# Patient Record
Sex: Male | Born: 1960 | Race: Black or African American | Hispanic: No | Smoking: Current every day smoker
Health system: Southern US, Community
[De-identification: ages and names within clinical notes are randomized; demographics above are authoritative.]

## PROBLEM LIST (undated history)

## (undated) DIAGNOSIS — W3400XA Accidental discharge from unspecified firearms or gun, initial encounter: Secondary | ICD-10-CM

## (undated) DIAGNOSIS — G8190 Hemiplegia, unspecified affecting unspecified side: Secondary | ICD-10-CM

## (undated) DIAGNOSIS — D860 Sarcoidosis of lung: Secondary | ICD-10-CM

## (undated) DIAGNOSIS — K852 Alcohol induced acute pancreatitis without necrosis or infection: Secondary | ICD-10-CM

## (undated) DIAGNOSIS — C3491 Malignant neoplasm of unspecified part of right bronchus or lung: Secondary | ICD-10-CM

## (undated) DIAGNOSIS — I10 Essential (primary) hypertension: Secondary | ICD-10-CM

## (undated) DIAGNOSIS — K2970 Gastritis, unspecified, without bleeding: Secondary | ICD-10-CM

## (undated) DIAGNOSIS — B9681 Helicobacter pylori [H. pylori] as the cause of diseases classified elsewhere: Secondary | ICD-10-CM

## (undated) DIAGNOSIS — R569 Unspecified convulsions: Secondary | ICD-10-CM

## (undated) DIAGNOSIS — E7800 Pure hypercholesterolemia, unspecified: Secondary | ICD-10-CM

## (undated) DIAGNOSIS — R918 Other nonspecific abnormal finding of lung field: Secondary | ICD-10-CM

## (undated) HISTORY — DX: Helicobacter pylori (H. pylori) as the cause of diseases classified elsewhere: B96.81

## (undated) HISTORY — DX: Other nonspecific abnormal finding of lung field: R91.8

## (undated) HISTORY — DX: Sarcoidosis of lung: D86.0

## (undated) HISTORY — DX: Gastritis, unspecified, without bleeding: K29.70

## (undated) HISTORY — DX: Alcohol induced acute pancreatitis without necrosis or infection: K85.20

---

## 2000-12-11 ENCOUNTER — Emergency Department (HOSPITAL_COMMUNITY): Admission: EM | Admit: 2000-12-11 | Discharge: 2000-12-11 | Payer: Self-pay | Admitting: Emergency Medicine

## 2000-12-11 ENCOUNTER — Encounter: Payer: Self-pay | Admitting: Emergency Medicine

## 2001-05-08 ENCOUNTER — Encounter: Payer: Self-pay | Admitting: *Deleted

## 2001-05-08 ENCOUNTER — Emergency Department (HOSPITAL_COMMUNITY): Admission: EM | Admit: 2001-05-08 | Discharge: 2001-05-08 | Payer: Self-pay | Admitting: *Deleted

## 2001-06-08 ENCOUNTER — Encounter: Payer: Self-pay | Admitting: Internal Medicine

## 2001-06-08 ENCOUNTER — Inpatient Hospital Stay (HOSPITAL_COMMUNITY): Admission: EM | Admit: 2001-06-08 | Discharge: 2001-06-10 | Payer: Self-pay | Admitting: Emergency Medicine

## 2001-06-08 ENCOUNTER — Encounter: Payer: Self-pay | Admitting: Emergency Medicine

## 2001-09-29 ENCOUNTER — Emergency Department (HOSPITAL_COMMUNITY): Admission: EM | Admit: 2001-09-29 | Discharge: 2001-09-29 | Payer: Self-pay | Admitting: Emergency Medicine

## 2001-10-15 ENCOUNTER — Emergency Department (HOSPITAL_COMMUNITY): Admission: EM | Admit: 2001-10-15 | Discharge: 2001-10-15 | Payer: Self-pay | Admitting: Emergency Medicine

## 2001-10-15 ENCOUNTER — Encounter: Payer: Self-pay | Admitting: Emergency Medicine

## 2001-12-24 ENCOUNTER — Encounter: Payer: Self-pay | Admitting: *Deleted

## 2001-12-24 ENCOUNTER — Emergency Department (HOSPITAL_COMMUNITY): Admission: EM | Admit: 2001-12-24 | Discharge: 2001-12-24 | Payer: Self-pay | Admitting: *Deleted

## 2002-03-04 ENCOUNTER — Emergency Department (HOSPITAL_COMMUNITY): Admission: EM | Admit: 2002-03-04 | Discharge: 2002-03-04 | Payer: Self-pay | Admitting: *Deleted

## 2002-05-02 ENCOUNTER — Emergency Department (HOSPITAL_COMMUNITY): Admission: EM | Admit: 2002-05-02 | Discharge: 2002-05-02 | Payer: Self-pay | Admitting: Emergency Medicine

## 2002-06-22 ENCOUNTER — Emergency Department (HOSPITAL_COMMUNITY): Admission: EM | Admit: 2002-06-22 | Discharge: 2002-06-22 | Payer: Self-pay | Admitting: *Deleted

## 2002-09-14 ENCOUNTER — Emergency Department (HOSPITAL_COMMUNITY): Admission: EM | Admit: 2002-09-14 | Discharge: 2002-09-14 | Payer: Self-pay | Admitting: Internal Medicine

## 2003-01-17 ENCOUNTER — Ambulatory Visit (HOSPITAL_COMMUNITY): Admission: RE | Admit: 2003-01-17 | Discharge: 2003-01-17 | Payer: Self-pay | Admitting: Urology

## 2003-01-17 ENCOUNTER — Encounter: Payer: Self-pay | Admitting: Urology

## 2003-09-03 ENCOUNTER — Observation Stay (HOSPITAL_COMMUNITY): Admission: EM | Admit: 2003-09-03 | Discharge: 2003-09-04 | Payer: Self-pay | Admitting: Emergency Medicine

## 2004-06-24 ENCOUNTER — Inpatient Hospital Stay (HOSPITAL_COMMUNITY): Admission: EM | Admit: 2004-06-24 | Discharge: 2004-06-27 | Payer: Self-pay | Admitting: *Deleted

## 2004-07-08 ENCOUNTER — Ambulatory Visit: Payer: Self-pay | Admitting: Family Medicine

## 2004-08-26 ENCOUNTER — Inpatient Hospital Stay (HOSPITAL_COMMUNITY): Admission: EM | Admit: 2004-08-26 | Discharge: 2004-08-30 | Payer: Self-pay | Admitting: Emergency Medicine

## 2004-09-01 ENCOUNTER — Emergency Department (HOSPITAL_COMMUNITY): Admission: EM | Admit: 2004-09-01 | Discharge: 2004-09-02 | Payer: Self-pay | Admitting: *Deleted

## 2004-09-17 ENCOUNTER — Ambulatory Visit: Payer: Self-pay | Admitting: Family Medicine

## 2004-09-23 ENCOUNTER — Encounter (HOSPITAL_COMMUNITY): Admission: RE | Admit: 2004-09-23 | Discharge: 2004-10-23 | Payer: Self-pay | Admitting: Family Medicine

## 2004-09-24 ENCOUNTER — Emergency Department (HOSPITAL_COMMUNITY): Admission: EM | Admit: 2004-09-24 | Discharge: 2004-09-24 | Payer: Self-pay | Admitting: Emergency Medicine

## 2004-09-25 ENCOUNTER — Ambulatory Visit: Payer: Self-pay | Admitting: Family Medicine

## 2004-10-09 ENCOUNTER — Emergency Department (HOSPITAL_COMMUNITY): Admission: EM | Admit: 2004-10-09 | Discharge: 2004-10-09 | Payer: Self-pay | Admitting: Emergency Medicine

## 2004-10-16 ENCOUNTER — Ambulatory Visit: Payer: Self-pay | Admitting: Family Medicine

## 2004-10-28 ENCOUNTER — Encounter (HOSPITAL_COMMUNITY): Admission: RE | Admit: 2004-10-28 | Discharge: 2004-11-27 | Payer: Self-pay | Admitting: Family Medicine

## 2004-12-02 ENCOUNTER — Encounter (HOSPITAL_COMMUNITY): Admission: RE | Admit: 2004-12-02 | Discharge: 2005-01-01 | Payer: Self-pay | Admitting: Family Medicine

## 2005-01-28 ENCOUNTER — Ambulatory Visit: Payer: Self-pay | Admitting: Family Medicine

## 2005-02-04 ENCOUNTER — Ambulatory Visit: Payer: Self-pay | Admitting: Family Medicine

## 2005-02-12 ENCOUNTER — Ambulatory Visit (HOSPITAL_COMMUNITY): Admission: RE | Admit: 2005-02-12 | Discharge: 2005-02-12 | Payer: Self-pay | Admitting: Family Medicine

## 2005-03-02 ENCOUNTER — Ambulatory Visit: Payer: Self-pay | Admitting: Family Medicine

## 2005-03-04 ENCOUNTER — Encounter (HOSPITAL_COMMUNITY): Admission: RE | Admit: 2005-03-04 | Discharge: 2005-04-03 | Payer: Self-pay | Admitting: Family Medicine

## 2005-05-27 ENCOUNTER — Inpatient Hospital Stay (HOSPITAL_COMMUNITY): Admission: EM | Admit: 2005-05-27 | Discharge: 2005-05-28 | Payer: Self-pay | Admitting: Emergency Medicine

## 2005-07-07 ENCOUNTER — Ambulatory Visit: Payer: Self-pay | Admitting: Family Medicine

## 2005-07-09 ENCOUNTER — Emergency Department (HOSPITAL_COMMUNITY): Admission: EM | Admit: 2005-07-09 | Discharge: 2005-07-09 | Payer: Self-pay | Admitting: Emergency Medicine

## 2005-07-13 ENCOUNTER — Ambulatory Visit (HOSPITAL_COMMUNITY): Admission: RE | Admit: 2005-07-13 | Discharge: 2005-07-13 | Payer: Self-pay | Admitting: Family Medicine

## 2005-07-27 ENCOUNTER — Emergency Department (HOSPITAL_COMMUNITY): Admission: EM | Admit: 2005-07-27 | Discharge: 2005-07-28 | Payer: Self-pay | Admitting: Emergency Medicine

## 2005-08-03 ENCOUNTER — Ambulatory Visit: Payer: Self-pay | Admitting: Family Medicine

## 2005-08-17 ENCOUNTER — Emergency Department (HOSPITAL_COMMUNITY): Admission: EM | Admit: 2005-08-17 | Discharge: 2005-08-17 | Payer: Self-pay | Admitting: Emergency Medicine

## 2005-08-26 ENCOUNTER — Ambulatory Visit: Payer: Self-pay | Admitting: Family Medicine

## 2005-09-17 ENCOUNTER — Emergency Department (HOSPITAL_COMMUNITY): Admission: EM | Admit: 2005-09-17 | Discharge: 2005-09-17 | Payer: Self-pay | Admitting: Emergency Medicine

## 2005-09-26 ENCOUNTER — Emergency Department (HOSPITAL_COMMUNITY): Admission: EM | Admit: 2005-09-26 | Discharge: 2005-09-27 | Payer: Self-pay | Admitting: Emergency Medicine

## 2005-09-29 ENCOUNTER — Emergency Department (HOSPITAL_COMMUNITY): Admission: EM | Admit: 2005-09-29 | Discharge: 2005-09-29 | Payer: Self-pay | Admitting: Emergency Medicine

## 2005-10-22 ENCOUNTER — Ambulatory Visit: Payer: Self-pay | Admitting: Family Medicine

## 2005-12-15 ENCOUNTER — Ambulatory Visit: Payer: Self-pay | Admitting: Family Medicine

## 2006-02-10 ENCOUNTER — Ambulatory Visit: Payer: Self-pay | Admitting: Family Medicine

## 2006-04-08 ENCOUNTER — Ambulatory Visit: Payer: Self-pay | Admitting: Family Medicine

## 2006-06-02 ENCOUNTER — Ambulatory Visit: Payer: Self-pay | Admitting: Family Medicine

## 2006-06-28 ENCOUNTER — Ambulatory Visit: Payer: Self-pay | Admitting: Family Medicine

## 2006-09-14 ENCOUNTER — Ambulatory Visit (HOSPITAL_COMMUNITY): Admission: RE | Admit: 2006-09-14 | Discharge: 2006-09-14 | Payer: Self-pay | Admitting: Family Medicine

## 2006-09-14 ENCOUNTER — Ambulatory Visit: Payer: Self-pay | Admitting: Family Medicine

## 2007-04-14 ENCOUNTER — Encounter: Payer: Self-pay | Admitting: Family Medicine

## 2007-07-02 ENCOUNTER — Inpatient Hospital Stay (HOSPITAL_COMMUNITY): Admission: RE | Admit: 2007-07-02 | Discharge: 2007-07-04 | Payer: Self-pay | Admitting: Cardiology

## 2007-07-02 ENCOUNTER — Ambulatory Visit: Payer: Self-pay | Admitting: Cardiology

## 2008-04-13 DIAGNOSIS — B9681 Helicobacter pylori [H. pylori] as the cause of diseases classified elsewhere: Secondary | ICD-10-CM

## 2008-04-13 HISTORY — DX: Helicobacter pylori (H. pylori) as the cause of diseases classified elsewhere: B96.81

## 2008-06-29 ENCOUNTER — Ambulatory Visit: Payer: Self-pay | Admitting: Gastroenterology

## 2008-06-29 ENCOUNTER — Inpatient Hospital Stay (HOSPITAL_COMMUNITY): Admission: EM | Admit: 2008-06-29 | Discharge: 2008-07-01 | Payer: Self-pay | Admitting: Emergency Medicine

## 2008-06-30 ENCOUNTER — Ambulatory Visit: Payer: Self-pay | Admitting: Internal Medicine

## 2008-07-03 ENCOUNTER — Telehealth (INDEPENDENT_AMBULATORY_CARE_PROVIDER_SITE_OTHER): Payer: Self-pay | Admitting: *Deleted

## 2008-07-10 ENCOUNTER — Encounter: Payer: Self-pay | Admitting: Gastroenterology

## 2008-07-18 ENCOUNTER — Encounter: Payer: Self-pay | Admitting: Internal Medicine

## 2008-07-23 ENCOUNTER — Telehealth (INDEPENDENT_AMBULATORY_CARE_PROVIDER_SITE_OTHER): Payer: Self-pay | Admitting: *Deleted

## 2008-08-03 DIAGNOSIS — R569 Unspecified convulsions: Secondary | ICD-10-CM | POA: Insufficient documentation

## 2008-08-03 DIAGNOSIS — K859 Acute pancreatitis without necrosis or infection, unspecified: Secondary | ICD-10-CM | POA: Insufficient documentation

## 2008-08-03 DIAGNOSIS — I10 Essential (primary) hypertension: Secondary | ICD-10-CM | POA: Insufficient documentation

## 2008-08-03 DIAGNOSIS — G8190 Hemiplegia, unspecified affecting unspecified side: Secondary | ICD-10-CM | POA: Insufficient documentation

## 2008-08-03 DIAGNOSIS — F17200 Nicotine dependence, unspecified, uncomplicated: Secondary | ICD-10-CM

## 2008-08-30 ENCOUNTER — Encounter: Payer: Self-pay | Admitting: Gastroenterology

## 2008-08-30 ENCOUNTER — Ambulatory Visit: Payer: Self-pay | Admitting: Internal Medicine

## 2008-08-30 DIAGNOSIS — K219 Gastro-esophageal reflux disease without esophagitis: Secondary | ICD-10-CM | POA: Insufficient documentation

## 2008-08-30 DIAGNOSIS — R040 Epistaxis: Secondary | ICD-10-CM

## 2008-08-30 DIAGNOSIS — R310 Gross hematuria: Secondary | ICD-10-CM

## 2008-08-30 LAB — CONVERTED CEMR LAB
ALT: 20 units/L (ref 0–53)
AST: 15 units/L (ref 0–37)
Alkaline Phosphatase: 153 units/L — ABNORMAL HIGH (ref 39–117)
Bilirubin, Direct: 0.1 mg/dL (ref 0.0–0.3)
Eosinophils Relative: 2 % (ref 0–5)
HCT: 41.3 % (ref 39.0–52.0)
Hemoglobin: 14 g/dL (ref 13.0–17.0)
Indirect Bilirubin: 0.2 mg/dL (ref 0.0–0.9)
Lipase: 39 units/L (ref 0–75)
Lymphocytes Relative: 41 % (ref 12–46)
Lymphs Abs: 3.5 10*3/uL (ref 0.7–4.0)
Monocytes Absolute: 0.5 10*3/uL (ref 0.1–1.0)
Monocytes Relative: 6 % (ref 3–12)
Neutro Abs: 4.2 10*3/uL (ref 1.7–7.7)
RBC: 4.61 M/uL (ref 4.22–5.81)
Total Bilirubin: 0.3 mg/dL (ref 0.3–1.2)

## 2008-09-05 ENCOUNTER — Telehealth: Payer: Self-pay | Admitting: Gastroenterology

## 2008-09-06 ENCOUNTER — Encounter: Payer: Self-pay | Admitting: Gastroenterology

## 2008-11-14 ENCOUNTER — Encounter (INDEPENDENT_AMBULATORY_CARE_PROVIDER_SITE_OTHER): Payer: Self-pay | Admitting: *Deleted

## 2008-11-14 ENCOUNTER — Encounter (INDEPENDENT_AMBULATORY_CARE_PROVIDER_SITE_OTHER): Payer: Self-pay

## 2009-01-10 ENCOUNTER — Ambulatory Visit: Payer: Self-pay | Admitting: Gastroenterology

## 2009-01-16 ENCOUNTER — Encounter: Payer: Self-pay | Admitting: Gastroenterology

## 2009-01-25 ENCOUNTER — Ambulatory Visit: Payer: Self-pay | Admitting: Gastroenterology

## 2009-01-25 ENCOUNTER — Ambulatory Visit (HOSPITAL_COMMUNITY): Admission: RE | Admit: 2009-01-25 | Discharge: 2009-01-25 | Payer: Self-pay | Admitting: Family Medicine

## 2009-01-25 ENCOUNTER — Encounter: Payer: Self-pay | Admitting: Gastroenterology

## 2009-01-25 HISTORY — PX: ESOPHAGOGASTRODUODENOSCOPY: SHX1529

## 2009-01-25 HISTORY — PX: COLONOSCOPY: SHX174

## 2009-01-28 ENCOUNTER — Encounter: Payer: Self-pay | Admitting: Gastroenterology

## 2009-03-06 ENCOUNTER — Telehealth (INDEPENDENT_AMBULATORY_CARE_PROVIDER_SITE_OTHER): Payer: Self-pay

## 2009-04-03 ENCOUNTER — Encounter (INDEPENDENT_AMBULATORY_CARE_PROVIDER_SITE_OTHER): Payer: Self-pay | Admitting: *Deleted

## 2009-06-06 ENCOUNTER — Ambulatory Visit: Payer: Self-pay | Admitting: Internal Medicine

## 2009-06-06 DIAGNOSIS — Z860100 Personal history of colon polyps, unspecified: Secondary | ICD-10-CM | POA: Insufficient documentation

## 2009-06-06 DIAGNOSIS — F1010 Alcohol abuse, uncomplicated: Secondary | ICD-10-CM | POA: Insufficient documentation

## 2009-06-06 DIAGNOSIS — Z8719 Personal history of other diseases of the digestive system: Secondary | ICD-10-CM

## 2009-11-03 ENCOUNTER — Emergency Department (HOSPITAL_COMMUNITY): Admission: EM | Admit: 2009-11-03 | Discharge: 2009-11-03 | Payer: Self-pay | Admitting: Emergency Medicine

## 2010-05-15 NOTE — Letter (Signed)
Summary: external records  external records   Imported By: Curtis Sites 12/03/2009 08:37:18  _____________________________________________________________________  External Attachment:    Type:   Image     Comment:   External Document
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
## 2010-05-20 ENCOUNTER — Encounter (INDEPENDENT_AMBULATORY_CARE_PROVIDER_SITE_OTHER): Payer: Self-pay | Admitting: *Deleted

## 2010-05-29 NOTE — Letter (Signed)
Summary: Recall Office Visit  [facility]  [address]   [place], [name] [address]   Phone: [phone]  Fax: [phone]      May 20, 2010   [name] [address] [place], [place]  [address] 07/23/1960   Dear Mr. Guia,   According to our records, it is time for you to schedule a follow-up office visit with us.   At your convenience, please call [phone] to schedule an office visit. If you have any questions, concerns, or feel that this letter is in error, we would appreciate your call.   Sincerely,    Susan Sherman  [facility] Ph: [phone]   Fax: [phone] 

## 2010-05-29 NOTE — Letter (Signed)
Summary: Recall Office Visit  [facility]  [address]   [place], [place] [address]   Phone: [phone]  Fax: [phone]      May 20, 2010   [facility] Heuer [address] [place], [place]  [address] Mar 24, 1961   Dear [name],   According to our records, it is time for you to schedule a follow-up office visit with [place].   At your convenience, please call [phone] to schedule an office visit. If you have any questions, concerns, or feel that this letter is in error, we would appreciate your call.   Sincerely,    [name]  [facility] Ph: [phone]   Fax: [phone]

## 2010-07-24 LAB — DIFFERENTIAL
Basophils Absolute: 0 10*3/uL (ref 0.0–0.1)
Basophils Relative: 1 % (ref 0–1)
Eosinophils Absolute: 0.2 10*3/uL (ref 0.0–0.7)
Lymphocytes Relative: 34 % (ref 12–46)
Lymphs Abs: 2.6 10*3/uL (ref 0.7–4.0)
Monocytes Absolute: 0.5 10*3/uL (ref 0.1–1.0)
Neutro Abs: 3.8 10*3/uL (ref 1.7–7.7)
Neutro Abs: 6 10*3/uL (ref 1.7–7.7)
Neutrophils Relative %: 63 % (ref 43–77)

## 2010-07-24 LAB — RAPID URINE DRUG SCREEN, HOSP PERFORMED
Barbiturates: POSITIVE — AB
Cocaine: NOT DETECTED
Opiates: POSITIVE — AB
Tetrahydrocannabinol: POSITIVE — AB

## 2010-07-24 LAB — COMPREHENSIVE METABOLIC PANEL
ALT: 28 U/L (ref 0–53)
Albumin: 3.2 g/dL — ABNORMAL LOW (ref 3.5–5.2)
BUN: 6 mg/dL (ref 6–23)
BUN: 7 mg/dL (ref 6–23)
CO2: 24 mEq/L (ref 19–32)
Calcium: 8.8 mg/dL (ref 8.4–10.5)
Chloride: 106 mEq/L (ref 96–112)
Creatinine, Ser: 0.76 mg/dL (ref 0.4–1.5)
GFR calc non Af Amer: >60 mL/min (ref >60)
Glucose, Bld: 143 mg/dL — ABNORMAL HIGH (ref 70–99)
Sodium: 135 mEq/L (ref 135–145)
Total Bilirubin: 0.4 mg/dL (ref 0.3–1.2)
Total Protein: 6.3 g/dL (ref 6.0–8.3)

## 2010-07-24 LAB — LIPASE, BLOOD: Lipase: 172 U/L — ABNORMAL HIGH (ref 11–59)

## 2010-07-24 LAB — HEPATIC FUNCTION PANEL
ALT: 24 U/L (ref 0–53)
AST: 20 U/L (ref 0–37)
Albumin: 3.6 g/dL (ref 3.5–5.2)
Alkaline Phosphatase: 157 U/L — ABNORMAL HIGH (ref 39–117)
Bilirubin, Direct: 0.1 mg/dL (ref 0.0–0.3)
Indirect Bilirubin: 0.1 mg/dL — ABNORMAL LOW (ref 0.3–0.9)
Total Bilirubin: 0.2 mg/dL — ABNORMAL LOW (ref 0.3–1.2)
Total Protein: 6.8 g/dL (ref 6.0–8.3)

## 2010-07-24 LAB — CBC
HCT: 42.8 % (ref 39.0–52.0)
HCT: 43 % (ref 39.0–52.0)
Hemoglobin: 14.9 g/dL (ref 13.0–17.0)
MCHC: 34.8 g/dL (ref 30.0–36.0)
MCV: 91.6 fL (ref 78.0–100.0)
MCV: 91.7 fL (ref 78.0–100.0)
Platelets: 132 10*3/uL — ABNORMAL LOW (ref 150–400)
RBC: 4.67 MIL/uL (ref 4.22–5.81)
RDW: 13.2 % (ref 11.5–15.5)

## 2010-07-24 LAB — URINALYSIS, ROUTINE W REFLEX MICROSCOPIC
Ketones, ur: NEGATIVE mg/dL
Nitrite: NEGATIVE
Protein, ur: NEGATIVE mg/dL
pH: 6.5 (ref 5.0–8.0)
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
## 2010-08-29 ENCOUNTER — Other Ambulatory Visit: Payer: Self-pay | Admitting: Medical
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
## 2010-08-29 NOTE — Group Therapy Note (Signed)
[name], [name] NO.:  [id number]   MEDICAL RECORD NO.:  [id number]          PATIENT TYPE:  INP   LOCATION:  IC06                          FACILITY:  [facility]   PHYSICIAN:  Vania Rea, M.D. DATE OF BIRTH:  [birth year]   DATE OF PROCEDURE:  06/25/2004  DATE OF DISCHARGE:                                   PROGRESS NOTE   Hospital day #2.  Patient did well overnight.  He had no further seizure  since being loaded with Dilantin and phenobarbital.  He developed fever  overnight and was started on antibiotics.  May have had some aspiration  issues since his ET tube was loose.  He was successfully extubated this  morning.   OBJECTIVE:  VITAL SIGNS:  His temperature is 101.1, Tmax 102.5, pulse 102,  respirations 20, blood pressure 144/95.  CHEST:  He has prolonged expiration and occasional diffuse rhonchi.  CARDIOVASCULAR:  Regular rhythm.  ABDOMEN:  Soft and nontender.  EXTREMITIES:  Without edema.   LABORATORIES:  White count has risen to 3.8.  His hemoglobin is stable at  14.1.  His serum chemistry:  Sodium 127, potassium 3.2, BUN 4, glucose 119.  His chest x-ray shows peribronchial thickening.   When questioned patient admits to smoking one pack per day of cigarettes.   ASSESSMENT:  1.  Status epilepticus, resolved.  2.  Acute bronchitis.  3.  Possible aspiration.  4.  Meningitis unlikely.  5.  Hypokalemia  6.  Hyponatremia   PLAN:  Continue to monitor phenobarbital and Dilantin levels.  Continue oral  phenobarbital and Dilantin.  Reduce Dilantin to low dose tonight, then  resume his normal dose the following night.  Replace K+; Will start him on a  progressive diet and that should resolve metabplic issues. Will change  antibx to Levaquin and clindamycin.      LC/MEDQ  D:  06/25/2004  T:  06/25/2004  Job:  [id number]
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
## 2010-08-29 NOTE — Discharge Summary (Signed)
[facility]  Patient:    [name], [name] Visit Number: [id number] MRN: [id number]          Service Type: MED Location: 2A A215 01 Attending Physician:  Cassell Smiles. Dictated by:   Elfredia Nevins, M.D. Admit Date:  06/08/2001 Discharge Date: 06/10/2001                             Discharge Summary  DISCHARGE DIAGNOSES: 1. Hypertension. 2. Abdominal pain secondary to partial small bowel obstruction. 3. 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  DISCHARGE MEDICATIONS:  Dilantin, phenobarbital.  SUMMARY:  Patient was admitted with abdominal pain on February 26.  He had a history of alcohol use and a one day duration of illness.  He responded to IV intervention after a brief episode of NG tube aspiration and bowel rest.  Has gradually improved.  Discharged eating well. Dictated by:   Elfredia Nevins, M.D. Attending Physician:  Cassell Smiles DD:  07/07/01 TD:  07/07/01 Job: [id number]
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
## 2010-08-29 NOTE — Procedures (Signed)
NAMEFILMORE, [name]                ACCOUNT NO.:  [id number]   MEDICAL RECORD NO.:  [id number]          PATIENT TYPE:  OUT   LOCATION:  RAD                           FACILITY:  [facility]   PHYSICIAN:  Dani Gobble, MD       DATE OF BIRTH:  03/03/1961   DATE OF PROCEDURE:  07/13/2005  DATE OF DISCHARGE:                                  ECHOCARDIOGRAM   REFERRING PHYSICIAN:  Dorthula Rue. Early Chars, MD   INDICATIONS:  50 year old gentleman with past medical history of  hypertension and murmur.   Technical quality of this study is adequate.   The aorta measures normally at 3 cm.   Left atrium is normal at 3.7 cm.  The patient appeared to be in sinus rhythm  during procedure.   Interventricular septum is mildly thickened as is the posterior wall.  The  aortic valve appeared to be mildly thickened but with normal leaflet  excursion.  Doppler interrogation of the aortic valve reveals peak velocity  of 1.9 meters per second corresponding to a peak gradient of 14 mmHg and  mean gradient of 8 mmHg.   The mitral valve appeared grossly structurally normal.  No mitral prolapse  is noted.  Mild mitral regurgitation is noted. Doppler interrogation of the  mitral valve is within normal limits.   The pulmonic valve was not well visualized.   Tricuspid valve appeared grossly structurally normal with mild tricuspid  regurgitation noted.   The left ventricle measured normal in size with LV IDD measured at 4 cm and  LV IC measured at 2.2 cm.  Overall left ventricular systolic function was  quite vigorous, and no regional wall motion abnormalities were noted.  There  was no evidence for diastolic dysfunction.   The right atrium and right ventricle were normal in size.  Right ventricular  systolic function was normal.   IMPRESSION:  1.  Mild concentric left ventricular hypertrophy.  2.  Mild aortic sclerosis without hemodynamically significant stenosis.  3.  Mild mitral and tricuspid regurgitation.  4.   Normal left ventricular size with vigorous left ventricle systolic      function and no regional wall motion abnormalities noted.           ______________________________  Dani Gobble, MD     AB/MEDQ  D:  07/13/2005  T:  07/13/2005  Job:  [id number]   cc:   Dorthula Rue. Early Chars, MD  Fax: [phone]
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
## 2010-08-29 NOTE — H&P (Signed)
[name], [name]                ACCOUNT NO.:  [id number]   MEDICAL RECORD NO.:  [id number]          PATIENT TYPE:  INP   LOCATION:  IC06                          FACILITY:  [facility]   PHYSICIAN:  Vania Rea, M.D. DATE OF BIRTH:  [birth year]   DATE OF ADMISSION:  06/24/2004  DATE OF DISCHARGE:  LH                                HISTORY & PHYSICAL   DUPLICATE ERROR   PRIMARY CARE PHYSICIAN:   CHIEF COMPLAINT:   HISTORY OF PRESENT ILLNESS:   PAST MEDICAL HISTORY:   MEDICATIONS:   ALLERGIES:   SOCIAL HISTORY:   FAMILY HISTORY:   REVIEW OF SYSTEMS:   PHYSICAL EXAMINATION:      LC/MEDQ  D:  06/24/2004  T:  06/24/2004  Job:  [id number]
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
## 2010-09-16 ENCOUNTER — Emergency Department (HOSPITAL_COMMUNITY)
Admission: EM | Admit: 2010-09-16 | Discharge: 2010-09-16 | Disposition: A | Discharge status: Home / Self Care | Payer: Medicare Other | Attending: Emergency Medicine | Admitting: Emergency Medicine

## 2010-09-16 DIAGNOSIS — E119 Type 2 diabetes mellitus without complications: Secondary | ICD-10-CM | POA: Insufficient documentation

## 2010-09-16 DIAGNOSIS — Z79899 Other long term (current) drug therapy: Secondary | ICD-10-CM | POA: Insufficient documentation

## 2010-09-16 DIAGNOSIS — R569 Unspecified convulsions: Secondary | ICD-10-CM | POA: Insufficient documentation

## 2010-09-16 DIAGNOSIS — I10 Essential (primary) hypertension: Secondary | ICD-10-CM | POA: Insufficient documentation

## 2010-09-16 DIAGNOSIS — F17200 Nicotine dependence, unspecified, uncomplicated: Secondary | ICD-10-CM | POA: Insufficient documentation

## 2010-09-16 LAB — CBC
HCT: 36 % — ABNORMAL LOW (ref 39.0–52.0)
MCH: 30.8 pg (ref 26.0–34.0)
MCHC: 33.6 g/dL (ref 30.0–36.0)
MCV: 91.6 fL (ref 78.0–100.0)
RDW: 13.3 % (ref 11.5–15.5)

## 2010-09-16 LAB — BASIC METABOLIC PANEL
BUN: 33 mg/dL — ABNORMAL HIGH (ref 6–23)
CO2: 18 mEq/L — ABNORMAL LOW (ref 19–32)
Calcium: 9.5 mg/dL (ref 8.4–10.5)
Creatinine, Ser: 1.57 mg/dL — ABNORMAL HIGH (ref 0.4–1.5)
GFR calc Af Amer: 57 mL/min — ABNORMAL LOW (ref >60)
Glucose, Bld: 105 mg/dL — ABNORMAL HIGH (ref 70–99)

## 2010-11-20 ENCOUNTER — Encounter: Payer: Self-pay | Admitting: *Deleted

## 2010-11-20 ENCOUNTER — Emergency Department (HOSPITAL_COMMUNITY): Payer: Medicare Other

## 2010-11-20 ENCOUNTER — Inpatient Hospital Stay (HOSPITAL_COMMUNITY): Payer: Medicare Other

## 2010-11-20 ENCOUNTER — Inpatient Hospital Stay (HOSPITAL_COMMUNITY)
Admission: EM | Admit: 2010-11-20 | Discharge: 2010-11-22 | DRG: 069 | Disposition: A | Discharge status: Home / Self Care | Payer: Medicare Other | Attending: Internal Medicine | Admitting: Internal Medicine

## 2010-11-20 ENCOUNTER — Other Ambulatory Visit: Payer: Self-pay

## 2010-11-20 DIAGNOSIS — F17200 Nicotine dependence, unspecified, uncomplicated: Secondary | ICD-10-CM | POA: Diagnosis present

## 2010-11-20 DIAGNOSIS — S069XAS Unspecified intracranial injury with loss of consciousness status unknown, sequela: Secondary | ICD-10-CM

## 2010-11-20 DIAGNOSIS — Z8719 Personal history of other diseases of the digestive system: Secondary | ICD-10-CM

## 2010-11-20 DIAGNOSIS — E875 Hyperkalemia: Secondary | ICD-10-CM | POA: Diagnosis present

## 2010-11-20 DIAGNOSIS — E119 Type 2 diabetes mellitus without complications: Secondary | ICD-10-CM

## 2010-11-20 DIAGNOSIS — G8190 Hemiplegia, unspecified affecting unspecified side: Secondary | ICD-10-CM | POA: Diagnosis present

## 2010-11-20 DIAGNOSIS — Z86010 Personal history of colonic polyps: Secondary | ICD-10-CM

## 2010-11-20 DIAGNOSIS — R040 Epistaxis: Secondary | ICD-10-CM

## 2010-11-20 DIAGNOSIS — S069X9S Unspecified intracranial injury with loss of consciousness of unspecified duration, sequela: Secondary | ICD-10-CM

## 2010-11-20 DIAGNOSIS — K219 Gastro-esophageal reflux disease without esophagitis: Secondary | ICD-10-CM

## 2010-11-20 DIAGNOSIS — Z720 Tobacco use: Secondary | ICD-10-CM

## 2010-11-20 DIAGNOSIS — G8191 Hemiplegia, unspecified affecting right dominant side: Secondary | ICD-10-CM

## 2010-11-20 DIAGNOSIS — K859 Acute pancreatitis without necrosis or infection, unspecified: Secondary | ICD-10-CM

## 2010-11-20 DIAGNOSIS — I10 Essential (primary) hypertension: Secondary | ICD-10-CM | POA: Diagnosis present

## 2010-11-20 DIAGNOSIS — G459 Transient cerebral ischemic attack, unspecified: Principal | ICD-10-CM | POA: Diagnosis present

## 2010-11-20 DIAGNOSIS — G40909 Epilepsy, unspecified, not intractable, without status epilepticus: Secondary | ICD-10-CM | POA: Diagnosis present

## 2010-11-20 DIAGNOSIS — I639 Cerebral infarction, unspecified: Secondary | ICD-10-CM

## 2010-11-20 DIAGNOSIS — F1010 Alcohol abuse, uncomplicated: Secondary | ICD-10-CM

## 2010-11-20 DIAGNOSIS — E785 Hyperlipidemia, unspecified: Secondary | ICD-10-CM

## 2010-11-20 DIAGNOSIS — R569 Unspecified convulsions: Secondary | ICD-10-CM

## 2010-11-20 DIAGNOSIS — R310 Gross hematuria: Secondary | ICD-10-CM

## 2010-11-20 HISTORY — DX: Unspecified convulsions: R56.9

## 2010-11-20 HISTORY — DX: Pure hypercholesterolemia, unspecified: E78.00

## 2010-11-20 HISTORY — DX: Accidental discharge from unspecified firearms or gun, initial encounter: W34.00XA

## 2010-11-20 HISTORY — DX: Essential (primary) hypertension: I10

## 2010-11-20 LAB — GLUCOSE, CAPILLARY
Glucose-Capillary: 142 mg/dL — ABNORMAL HIGH (ref 70–99)
Glucose-Capillary: 101 mg/dL — ABNORMAL HIGH (ref 70–99)

## 2010-11-20 LAB — CBC
HCT: 34.8 % — ABNORMAL LOW (ref 39.0–52.0)
Platelets: 199 10*3/uL (ref 150–400)
RDW: 13.4 % (ref 11.5–15.5)
WBC: 7 10*3/uL (ref 4.0–10.5)

## 2010-11-20 LAB — DIFFERENTIAL
Basophils Absolute: 0 10*3/uL (ref 0.0–0.1)
Basophils Relative: 1 % (ref 0–1)
Lymphocytes Relative: 32 % (ref 12–46)
Monocytes Absolute: 0.5 10*3/uL (ref 0.1–1.0)
Neutro Abs: 4.1 10*3/uL (ref 1.7–7.7)
Neutrophils Relative %: 58 % (ref 43–77)

## 2010-11-20 LAB — COMPREHENSIVE METABOLIC PANEL
ALT: 12 U/L (ref 0–53)
AST: 15 U/L (ref 0–37)
Albumin: 3.7 g/dL (ref 3.5–5.2)
Calcium: 8.9 mg/dL (ref 8.4–10.5)
Creatinine, Ser: 1.07 mg/dL (ref 0.50–1.35)
Sodium: 138 mEq/L (ref 135–145)
Total Protein: 7.6 g/dL (ref 6.0–8.3)

## 2010-11-20 LAB — URINALYSIS, ROUTINE W REFLEX MICROSCOPIC
Bilirubin Urine: NEGATIVE
Leukocytes, UA: NEGATIVE
Nitrite: NEGATIVE
Specific Gravity, Urine: 1.03 (ref 1.005–1.030)
Urobilinogen, UA: 0.2 mg/dL (ref 0.0–1.0)
pH: 5.5 (ref 5.0–8.0)

## 2010-11-20 LAB — TYPE AND SCREEN: Antibody Screen: NEGATIVE

## 2010-11-20 LAB — APTT: aPTT: 29 seconds (ref 24–37)

## 2010-11-20 LAB — CK TOTAL AND CKMB (NOT AT ARMC)
CK, MB: 2.9 ng/mL (ref 0.3–4.0)
Relative Index: 1.5 (ref 0.0–2.5)
Total CK: 198 U/L (ref 7–232)

## 2010-11-20 LAB — PROTIME-INR: Prothrombin Time: 13.2 seconds (ref 11.6–15.2)

## 2010-11-20 LAB — TROPONIN I: Troponin I: <0.3 ng/mL (ref <0.30)

## 2010-11-20 MED ORDER — INSULIN ASPART 100 UNIT/ML ~~LOC~~ SOLN
0.0000 [IU] | Freq: Three times a day (TID) | SUBCUTANEOUS | Status: DC
  Administered 2010-11-20 – 2010-11-21 (×3): 2 [IU] via SUBCUTANEOUS
  Filled 2010-11-20: qty 3

## 2010-11-20 MED ORDER — SENNOSIDES-DOCUSATE SODIUM 8.6-50 MG PO TABS: 1.0000 | ORAL_TABLET | Freq: Every day | ORAL | Status: DC | PRN

## 2010-11-20 MED ORDER — PNEUMOCOCCAL VAC POLYVALENT 25 MCG/0.5ML IJ INJ
0.5000 mL | INJECTION | Freq: Once | INTRAMUSCULAR | Status: AC
  Administered 2010-11-21: 0.5 mL via INTRAMUSCULAR
  Filled 2010-11-20: qty 0.5

## 2010-11-20 MED ORDER — ACETAMINOPHEN 650 MG RE SUPP: 650.0000 mg | Freq: Four times a day (QID) | RECTAL | Status: DC | PRN

## 2010-11-20 MED ORDER — ENOXAPARIN SODIUM 30 MG/0.3ML ~~LOC~~ SOLN
30.0000 mg | SUBCUTANEOUS | Status: DC
  Administered 2010-11-20 – 2010-11-22 (×3): 30 mg via SUBCUTANEOUS
  Filled 2010-11-20 (×2): qty 0.8
  Filled 2010-11-20: qty 0.3

## 2010-11-20 MED ORDER — SODIUM CHLORIDE 0.9 % IV BOLUS (SEPSIS)
500.0000 mL | Freq: Once | INTRAVENOUS | Status: AC
  Administered 2010-11-20: 500 mL via INTRAVENOUS

## 2010-11-20 MED ORDER — SODIUM CHLORIDE 0.9 % IV SOLN
INTRAVENOUS | Status: DC
  Administered 2010-11-21: 06:00:00 via INTRAVENOUS

## 2010-11-20 MED ORDER — ONDANSETRON HCL 4 MG/2ML IJ SOLN: 4.0000 mg | Freq: Four times a day (QID) | INTRAMUSCULAR | Status: DC | PRN

## 2010-11-20 MED ORDER — ONDANSETRON HCL 4 MG PO TABS: 4.0000 mg | ORAL_TABLET | Freq: Four times a day (QID) | ORAL | Status: DC | PRN

## 2010-11-20 MED ORDER — ZOLPIDEM TARTRATE 5 MG PO TABS: 5.0000 mg | ORAL_TABLET | Freq: Every evening | ORAL | Status: DC | PRN

## 2010-11-20 MED ORDER — ACETAMINOPHEN 325 MG PO TABS: 650.0000 mg | ORAL_TABLET | Freq: Four times a day (QID) | ORAL | Status: DC | PRN

## 2010-11-20 MED ORDER — PNEUMOCOCCAL VAC POLYVALENT 25 MCG/0.5ML IJ INJ: 0.5000 mL | INJECTION | INTRAMUSCULAR | Status: DC

## 2010-11-20 MED ORDER — SODIUM CHLORIDE 0.9 % IV SOLN
INTRAVENOUS | Status: DC
  Administered 2010-11-20: 75 mL via INTRAVENOUS

## 2010-11-20 NOTE — ED Notes (Signed)
Hospitalist assessed pt and is going to enter admission orders.

## 2010-11-20 NOTE — Consult Note (Signed)
Spoke with Dr. Karilyn Cota who will see/admit the patient.

## 2010-11-20 NOTE — ED Notes (Signed)
Pt alert, oriented.  EDP performed neuro assessment on pt.  Pt denying pain.
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
## 2010-11-20 NOTE — ED Notes (Signed)
Pt to ct at 06:24, returning at 06:45

## 2010-11-20 NOTE — ED Notes (Signed)
Pt stating no increase or decrease in neurologic deficits, no vision change from his norm,swallow screen passed

## 2010-11-20 NOTE — ED Notes (Signed)
Pt alert and oriented.  VSS.  R side extremities at baseline.l  Weakness still noted to left side.
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
## 2010-11-20 NOTE — Progress Notes (Signed)
Care/disposition to Dr. Campos. 

## 2010-11-21 ENCOUNTER — Encounter (HOSPITAL_COMMUNITY): Payer: Self-pay

## 2010-11-21 DIAGNOSIS — I319 Disease of pericardium, unspecified: Secondary | ICD-10-CM

## 2010-11-21 LAB — COMPREHENSIVE METABOLIC PANEL
Albumin: 2.8 g/dL — ABNORMAL LOW (ref 3.5–5.2)
Alkaline Phosphatase: 126 U/L — ABNORMAL HIGH (ref 39–117)
BUN: 13 mg/dL (ref 6–23)
Creatinine, Ser: 0.97 mg/dL (ref 0.50–1.35)
GFR calc Af Amer: >60 mL/min (ref >60)
Glucose, Bld: 104 mg/dL — ABNORMAL HIGH (ref 70–99)
Potassium: 4.3 mEq/L (ref 3.5–5.1)
Total Protein: 6.4 g/dL (ref 6.0–8.3)

## 2010-11-21 LAB — CBC
HCT: 32.3 % — ABNORMAL LOW (ref 39.0–52.0)
MCV: 92.6 fL (ref 78.0–100.0)
Platelets: 164 10*3/uL (ref 150–400)
RBC: 3.49 MIL/uL — ABNORMAL LOW (ref 4.22–5.81)
RDW: 12.7 % (ref 11.5–15.5)
WBC: 7.6 10*3/uL (ref 4.0–10.5)

## 2010-11-21 LAB — GLUCOSE, CAPILLARY: Glucose-Capillary: 148 mg/dL — ABNORMAL HIGH (ref 70–99)

## 2010-11-21 LAB — HEMOGLOBIN A1C: Mean Plasma Glucose: 128 mg/dL — ABNORMAL HIGH (ref <117)

## 2010-11-21 MED ORDER — ASPIRIN 325 MG PO TABS
325.0000 mg | ORAL_TABLET | Freq: Every day | ORAL | Status: DC
  Administered 2010-11-21 – 2010-11-22 (×2): 325 mg via ORAL
  Filled 2010-11-21 (×2): qty 1

## 2010-11-21 MED ORDER — PHENOBARBITAL 97.2 MG PO TABS: 97.2000 mg | ORAL_TABLET | Freq: Two times a day (BID) | ORAL | Status: DC

## 2010-11-21 MED ORDER — AMLODIPINE BESYLATE 5 MG PO TABS
10.0000 mg | ORAL_TABLET | Freq: Every day | ORAL | Status: DC
Start: 2010-11-21 — End: 2010-11-22
  Administered 2010-11-21: 10 mg via ORAL
  Administered 2010-11-22: 5 mg via ORAL
  Filled 2010-11-21: qty 1
  Filled 2010-11-21: qty 2

## 2010-11-21 MED ORDER — PANTOPRAZOLE SODIUM 40 MG PO TBEC
40.0000 mg | DELAYED_RELEASE_TABLET | Freq: Every day | ORAL | Status: DC
  Administered 2010-11-21: 40 mg via ORAL
  Filled 2010-11-21 (×2): qty 1

## 2010-11-21 MED ORDER — LEVETIRACETAM 500 MG PO TABS
1000.0000 mg | ORAL_TABLET | Freq: Two times a day (BID) | ORAL | Status: DC
  Administered 2010-11-21: 1000 mg via ORAL
  Administered 2010-11-22: 500 mg via ORAL
  Filled 2010-11-21 (×2): qty 1

## 2010-11-21 MED ORDER — SIMVASTATIN 20 MG PO TABS
20.0000 mg | ORAL_TABLET | Freq: Every day | ORAL | Status: DC
  Administered 2010-11-21: 20 mg via ORAL
  Filled 2010-11-21: qty 1

## 2010-11-21 MED ORDER — OLMESARTAN MEDOXOMIL 20 MG PO TABS
40.0000 mg | ORAL_TABLET | Freq: Every day | ORAL | Status: DC
  Administered 2010-11-21: 40 mg via ORAL
  Administered 2010-11-22: 20 mg via ORAL
  Filled 2010-11-21: qty 1
  Filled 2010-11-21: qty 2

## 2010-11-21 MED ORDER — PHENOBARBITAL 100 MG PO TABS
100.0000 mg | ORAL_TABLET | Freq: Two times a day (BID) | ORAL | Status: DC
  Administered 2010-11-21 – 2010-11-22 (×2): 100 mg via ORAL
  Filled 2010-11-21 (×2): qty 1

## 2010-11-21 MED ORDER — LOSARTAN POTASSIUM 50 MG PO TABS
100.0000 mg | ORAL_TABLET | Freq: Every day | ORAL | Status: DC
  Administered 2010-11-21 – 2010-11-22 (×2): 100 mg via ORAL
  Filled 2010-11-21: qty 1
  Filled 2010-11-21: qty 2

## 2010-11-21 MED ORDER — HYDROCHLOROTHIAZIDE 25 MG PO TABS
25.0000 mg | ORAL_TABLET | Freq: Every day | ORAL | Status: DC
  Administered 2010-11-21 – 2010-11-22 (×2): 25 mg via ORAL
  Filled 2010-11-21 (×2): qty 1

## 2010-11-21 NOTE — Progress Notes (Signed)
*  PRELIMINARY RESULTS* Echocardiogram 2D Echocardiogram has been performed.  [name] 11/21/2010, 9:10 AM
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
## 2010-11-21 NOTE — Progress Notes (Signed)
Pt to be transferred per MD order. Report given to RN. Pt transferred via bed.

## 2010-11-22 ENCOUNTER — Inpatient Hospital Stay (HOSPITAL_COMMUNITY): Payer: Medicare Other

## 2010-11-22 LAB — GLUCOSE, CAPILLARY
Glucose-Capillary: 98 mg/dL (ref 70–99)
Glucose-Capillary: 128 mg/dL — ABNORMAL HIGH (ref 70–99)

## 2010-11-22 MED ORDER — ASPIRIN 325 MG PO TABS: 325.0000 mg | ORAL_TABLET | Freq: Every day | ORAL | Status: AC
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
## 2010-11-22 NOTE — Progress Notes (Signed)
Discharged home with family.

## 2010-11-27 NOTE — Progress Notes (Signed)
Encounter addended by: Ree Shay, RN on: 11/27/2010  1:53 PM<BR>     Documentation filed: Charges VN

## 2010-12-12 NOTE — Progress Notes (Signed)
Encounter addended by: Clarene Critchley on: 12/12/2010  8:52 AM<BR>     Documentation filed: Flowsheet VN

## 2011-01-05 LAB — CARDIAC PANEL(CRET KIN+CKTOT+MB+TROPI)
CK, MB: 5.2 — ABNORMAL HIGH
Relative Index: 0.3
Relative Index: 0.3
Total CK: 1848 — ABNORMAL HIGH
Troponin I: <0.01

## 2011-01-05 LAB — RAPID URINE DRUG SCREEN, HOSP PERFORMED
Amphetamines: NOT DETECTED
Barbiturates: POSITIVE — AB
Tetrahydrocannabinol: POSITIVE — AB

## 2011-01-05 LAB — DIFFERENTIAL
Eosinophils Absolute: 0.2
Eosinophils Relative: 2
Lymphocytes Relative: 35
Monocytes Absolute: 0.6
Neutrophils Relative %: 55

## 2011-01-05 LAB — CULTURE, BLOOD (ROUTINE X 2): Culture: NO GROWTH

## 2011-01-05 LAB — POCT CARDIAC MARKERS
CKMB, poc: 5.6
Myoglobin, poc: 140
Operator id: 216221

## 2011-01-05 LAB — COMPREHENSIVE METABOLIC PANEL
ALT: 26
AST: 30
Albumin: 3.3 — ABNORMAL LOW
Alkaline Phosphatase: 124 — ABNORMAL HIGH
Chloride: 103
GFR calc Af Amer: >60
Potassium: 4.1
Total Bilirubin: 0.5

## 2011-01-05 LAB — CBC
HCT: 41.7
MCHC: 35
MCV: 88.8
Platelets: 146 — ABNORMAL LOW
Platelets: 152
WBC: 8.4
WBC: 8.5

## 2011-01-05 LAB — BASIC METABOLIC PANEL
BUN: 9
Calcium: 8.7
Chloride: 103
Creatinine, Ser: 1.04
GFR calc Af Amer: >60

## 2011-01-05 LAB — LIPID PANEL
HDL: 23 — ABNORMAL LOW
Triglycerides: 157 — ABNORMAL HIGH

## 2011-01-05 LAB — SEDIMENTATION RATE: Sed Rate: 15

## 2011-06-20 ENCOUNTER — Encounter (HOSPITAL_COMMUNITY): Payer: Self-pay

## 2011-06-20 ENCOUNTER — Emergency Department (HOSPITAL_COMMUNITY): Payer: Medicare Other

## 2011-06-20 ENCOUNTER — Other Ambulatory Visit: Payer: Self-pay

## 2011-06-20 ENCOUNTER — Emergency Department (HOSPITAL_COMMUNITY)
Admission: EM | Admit: 2011-06-20 | Discharge: 2011-06-20 | Disposition: A | Discharge status: Home / Self Care | Payer: Medicare Other | Attending: Emergency Medicine | Admitting: Emergency Medicine

## 2011-06-20 DIAGNOSIS — F17200 Nicotine dependence, unspecified, uncomplicated: Secondary | ICD-10-CM | POA: Insufficient documentation

## 2011-06-20 DIAGNOSIS — R079 Chest pain, unspecified: Secondary | ICD-10-CM | POA: Insufficient documentation

## 2011-06-20 DIAGNOSIS — J189 Pneumonia, unspecified organism: Secondary | ICD-10-CM

## 2011-06-20 DIAGNOSIS — Z79899 Other long term (current) drug therapy: Secondary | ICD-10-CM | POA: Insufficient documentation

## 2011-06-20 LAB — POCT I-STAT, CHEM 8
Calcium, Ion: 1.05 mmol/L — ABNORMAL LOW (ref 1.12–1.32)
Chloride: 110 mEq/L (ref 96–112)
Glucose, Bld: 138 mg/dL — ABNORMAL HIGH (ref 70–99)
HCT: 41 % (ref 39.0–52.0)

## 2011-06-20 LAB — URINALYSIS, ROUTINE W REFLEX MICROSCOPIC
Bilirubin Urine: NEGATIVE
Ketones, ur: NEGATIVE mg/dL
Nitrite: NEGATIVE
Protein, ur: NEGATIVE mg/dL
Urobilinogen, UA: 0.2 mg/dL (ref 0.0–1.0)
pH: 7 (ref 5.0–8.0)

## 2011-06-20 LAB — CBC
MCH: 30.7 pg (ref 26.0–34.0)
MCHC: 34.5 g/dL (ref 30.0–36.0)
MCV: 88.9 fL (ref 78.0–100.0)
Platelets: 129 10*3/uL — ABNORMAL LOW (ref 150–400)
RDW: 13.7 % (ref 11.5–15.5)

## 2011-06-20 LAB — POCT I-STAT TROPONIN I

## 2011-06-20 MED ORDER — PANTOPRAZOLE SODIUM 40 MG IV SOLR
40.0000 mg | Freq: Once | INTRAVENOUS | Status: AC
  Administered 2011-06-20: 40 mg via INTRAVENOUS
  Filled 2011-06-20: qty 40

## 2011-06-20 MED ORDER — MOXIFLOXACIN HCL IN NACL 400 MG/250ML IV SOLN
400.0000 mg | Freq: Once | INTRAVENOUS | Status: AC
  Administered 2011-06-20: 400 mg via INTRAVENOUS
  Filled 2011-06-20: qty 250

## 2011-06-20 MED ORDER — MOXIFLOXACIN HCL 400 MG PO TABS: 400.0000 mg | ORAL_TABLET | Freq: Every day | ORAL | Status: AC

## 2011-06-20 MED ORDER — HYDROMORPHONE HCL PF 1 MG/ML IJ SOLN: 1.0000 mg | Freq: Once | INTRAMUSCULAR | Status: DC

## 2011-06-20 MED ORDER — SODIUM CHLORIDE 0.9 % IJ SOLN: 3.0000 mL | Freq: Two times a day (BID) | INTRAMUSCULAR | Status: DC

## 2011-06-20 MED ORDER — SODIUM CHLORIDE 0.9 % IV BOLUS (SEPSIS)
1000.0000 mL | Freq: Once | INTRAVENOUS | Status: AC
  Administered 2011-06-20: 1000 mL via INTRAVENOUS

## 2011-06-20 MED ORDER — IOHEXOL 350 MG/ML SOLN
60.0000 mL | Freq: Once | INTRAVENOUS | Status: AC | PRN
  Administered 2011-06-20: 60 mL via INTRAVENOUS

## 2011-06-20 NOTE — ED Notes (Signed)
A/O x 4, NAD, no CP/SOB reported.  Family at bedside.

## 2011-06-20 NOTE — ED Notes (Addendum)
Pt is discharged and [facility] has been called for transport to the pt's residence.  Awaiting transportation. Dinner tray ordered.

## 2011-06-20 NOTE — ED Notes (Signed)
Pt is sleeping

## 2011-06-20 NOTE — ED Notes (Signed)
Pt given dinner tray and sprite.  
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
## 2011-06-20 NOTE — ED Notes (Signed)
IV therapy team paged for additional IV access for pt having PE study.

## 2011-06-20 NOTE — ED Notes (Signed)
Pt voided 375 ccs of dark yellow urine on arrival to ED
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
## 2011-06-20 NOTE — ED Notes (Signed)
[facility] onscene for transport home.

## 2011-06-20 NOTE — ED Notes (Signed)
No bld cx ordered PT abx order

## 2011-06-20 NOTE — ED Notes (Signed)
Pt repositioned in bed for comfort.

## 2011-06-20 NOTE — Discharge Instructions (Signed)
YOU CAN BE DISCHARGED HOME AND SHOULD FOLLOW UP WITH YOUR DOCTOR FOR RECHECK IN 1-2 DAYS. TAKE AVELOX AS DIRECTED. TYLENOL IF ANY FEVER DEVELOPS AND FOR ACHES AS NEEDED. RETURN HERE WITH ANY HIGH FEVER, SHORTNESS OF BREATH OR NEW CONCERN.  Pneumonia, Adult Pneumonia is an infection of the lungs.  CAUSES Pneumonia may be caused by bacteria or a virus. Usually, these infections are caused by breathing infectious particles into the lungs (respiratory tract). SYMPTOMS   Cough.   Fever.   Chest pain.   Increased rate of breathing.   Wheezing.   Mucus production.  DIAGNOSIS  If you have the common symptoms of pneumonia, your caregiver will typically confirm the diagnosis with a chest X-ray. The X-ray will show an abnormality in the lung (pulmonary infiltrate) if you have pneumonia. Other tests of your blood, urine, or sputum may be done to find the specific cause of your pneumonia. Your caregiver may also do tests (blood gases or pulse oximetry) to see how well your lungs are working. TREATMENT  Some forms of pneumonia may be spread to other people when you cough or sneeze. You may be asked to wear a mask before and during your exam. Pneumonia that is caused by bacteria is treated with antibiotic medicine. Pneumonia that is caused by the influenza virus may be treated with an antiviral medicine. Most other viral infections must run their course. These infections will not respond to antibiotics.  PREVENTION A pneumococcal shot (vaccine) is available to prevent a common bacterial cause of pneumonia. This is usually suggested for:  People over 71 years old.   Patients on chemotherapy.   People with chronic lung problems, such as bronchitis or emphysema.   People with immune system problems.  If you are over 65 or have a high risk condition, you may receive the pneumococcal vaccine if you have not received it before. In some countries, a routine influenza vaccine is also recommended. This  vaccine can help prevent some cases of pneumonia.You may be offered the influenza vaccine as part of your care. If you smoke, it is time to quit. You may receive instructions on how to stop smoking. Your caregiver can provide medicines and counseling to help you quit. HOME CARE INSTRUCTIONS   Cough suppressants may be used if you are losing too much rest. However, coughing protects you by clearing your lungs. You should avoid using cough suppressants if you can.   Your caregiver may have prescribed medicine if he or she thinks your pneumonia is caused by a bacteria or influenza. Finish your medicine even if you start to feel better.   Your caregiver may also prescribe an expectorant. This loosens the mucus to be coughed up.   Only take over-the-counter or prescription medicines for pain, discomfort, or fever as directed by your caregiver.   Do not smoke. Smoking is a common cause of bronchitis and can contribute to pneumonia. If you are a smoker and continue to smoke, your cough may last several weeks after your pneumonia has cleared.   A cold steam vaporizer or humidifier in your room or home may help loosen mucus.   Coughing is often worse at night. Sleeping in a semi-upright position in a recliner or using a couple pillows under your head will help with this.   Get rest as you feel it is needed. Your body will usually let you know when you need to rest.  SEEK IMMEDIATE MEDICAL CARE IF:   Your illness becomes worse.  This is especially true if you are elderly or weakened from any other disease.   You cannot control your cough with suppressants and are losing sleep.   You begin coughing up blood.   You develop pain which is getting worse or is uncontrolled with medicines.   You have a fever.   Any of the symptoms which initially brought you in for treatment are getting worse rather than better.   You develop shortness of breath or chest pain.  MAKE SURE YOU:   Understand these  instructions.   Will watch your condition.   Will get help right away if you are not doing well or get worse.  Document Released: 03/30/2005 Document Revised: 03/19/2011 Document Reviewed: 06/19/2010 [facility] Patient Information 2012 [place], [place].
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
## 2011-06-20 NOTE — ED Notes (Signed)
Patient is resting comfortably. 

## 2011-06-20 NOTE — ED Notes (Signed)
Patient denies pain and is resting comfortably.  

## 2011-06-21 NOTE — ED Provider Notes (Signed)
I saw and evaluated the patient, reviewed the resident's note and I agree with the findings and plan.   Dione Booze, MD 06/21/11 [phone]

## 2011-06-22 NOTE — ED Provider Notes (Signed)
Medical screening examination/treatment/procedure(s) were conducted as a shared visit with non-physician practitioner(s) and myself.  I personally evaluated the patient during the encounter   Loren Racer, MD 06/22/11 1529

## 2012-05-16 ENCOUNTER — Telehealth: Payer: Self-pay | Admitting: *Deleted

## 2012-05-16 NOTE — Telephone Encounter (Signed)
[name] called today. He is having bad diarrhea and would like to know if we can do anything for him. Please call him back. Thank you.

## 2012-05-16 NOTE — Telephone Encounter (Signed)
I called pt. He said he has had diarrhea x 2 days and he has about 2 stools daily and has some blood in it. Ov scheduled with Lorenza Burton, NP for 8:30 AM  05/17/2012 at 8:30 Am. He said his aid gets there about 6:30 in the morning and he will see if she can bring him. That is the only transportation he will have and she is off today.

## 2012-05-16 NOTE — Telephone Encounter (Signed)
Called. Left the message on VM and asked him to call and let me know that he got the message.

## 2012-05-16 NOTE — Telephone Encounter (Signed)
Agree, to ER if severe pain, dehydrated, dizzy or large amounts of blood

## 2012-05-17 ENCOUNTER — Ambulatory Visit: Payer: Medicare Other | Admitting: Urgent Care

## 2012-05-17 ENCOUNTER — Telehealth: Payer: Self-pay | Admitting: Urgent Care

## 2012-05-17 NOTE — Telephone Encounter (Signed)
noted 

## 2012-05-17 NOTE — Telephone Encounter (Signed)
Pt was a no show

## 2012-05-17 NOTE — Telephone Encounter (Signed)
Called pt's aid, [name]. She said pt did not tell her of the appt this AM. She has rescheduled to 05/31/2012 at 2:30 PM with KJ. She is aware he needs to go to the ED if he has worsening problems.

## 2012-05-30 ENCOUNTER — Encounter: Payer: Self-pay | Admitting: Gastroenterology

## 2012-05-31 ENCOUNTER — Encounter: Payer: Self-pay | Admitting: Urgent Care

## 2012-05-31 ENCOUNTER — Telehealth: Payer: Self-pay | Admitting: Urgent Care

## 2012-05-31 ENCOUNTER — Ambulatory Visit (INDEPENDENT_AMBULATORY_CARE_PROVIDER_SITE_OTHER): Payer: Medicare Other | Admitting: Urgent Care

## 2012-05-31 VITALS — BP 108/69 | HR 64 | Temp 97.2°F

## 2012-05-31 DIAGNOSIS — Z860100 Personal history of colon polyps, unspecified: Secondary | ICD-10-CM

## 2012-05-31 DIAGNOSIS — R109 Unspecified abdominal pain: Secondary | ICD-10-CM

## 2012-05-31 DIAGNOSIS — R197 Diarrhea, unspecified: Secondary | ICD-10-CM

## 2012-05-31 DIAGNOSIS — K625 Hemorrhage of anus and rectum: Secondary | ICD-10-CM

## 2012-05-31 MED ORDER — HYDROCODONE-ACETAMINOPHEN 5-325 MG PO TABS: 1.0000 | ORAL_TABLET | Freq: Four times a day (QID) | ORAL | Status: DC | PRN

## 2012-05-31 NOTE — Patient Instructions (Addendum)
We will arrange colonoscopy & possible EGD with Dr Darrick Penna Please get your labs as soon as possible.  We will call you with results. Continue omeprazole 20mg  daily You may use hydrocodone as directed for pain To ER if severe pain or bleeding
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
## 2012-05-31 NOTE — Telephone Encounter (Signed)
Please schedule colonoscopy +/- EGD w/ [facility] re: rectal bleeding with pt's CNA, Ms.Claybrooks Pt will need Entrada overtube & 2-day prep Miralax 2 days before & Trilyte on day before his procedure Clear Liquids should begin with dinner 2 days before his procedure Thanks

## 2012-06-01 ENCOUNTER — Other Ambulatory Visit: Payer: Self-pay | Admitting: Gastroenterology

## 2012-06-01 ENCOUNTER — Encounter: Payer: Self-pay | Admitting: Urgent Care

## 2012-06-01 ENCOUNTER — Encounter (HOSPITAL_COMMUNITY): Payer: Self-pay | Admitting: Pharmacy Technician

## 2012-06-01 DIAGNOSIS — K625 Hemorrhage of anus and rectum: Secondary | ICD-10-CM

## 2012-06-01 DIAGNOSIS — R197 Diarrhea, unspecified: Secondary | ICD-10-CM

## 2012-06-01 MED ORDER — PEG 3350-KCL-NA BICARB-NACL 420 G PO SOLR: 4000.0000 mL | ORAL | Status: DC

## 2012-06-01 NOTE — Assessment & Plan Note (Addendum)
[name] is a pleasant 52 y.o. male with bloody diarrhea for the past 2-3 weeks. He has history of multiple adenomas removed from his colon, one was quite large 1.8 cm on last colonoscopy in 2010. He also has diverticulosis. He will need colonoscopy with Dr. Darrick Penna to determine an etiology of his bleeding. If nothing is found on colonoscopy would consider EGD as he is at risk for peptic ulcer disease.  I have discussed risks & benefits which include, but are not limited to, bleeding, infection, perforation & drug reaction.  The patient agrees with this plan & written consent will be obtained.    He will need colonoscopy with Entrada overtube and 2 day bowel prep. MiraLax 2 days prior to this procedure, trilyte prep 1 day prior to his procedure, clear liquids for 2 days prior to his procedure. Check CBC Full set of stool studies Continue omeprazole 20mg  daily

## 2012-06-01 NOTE — Assessment & Plan Note (Signed)
2-3 week history of mid abdominal pain. He has history of H. pylori gastritis, alcohol abuse, and alcoholic pancreatitis. He is currently not drinking. I suspect his abdominal pain is related to his bloody diarrhea.  Differentials include acute colitis, inflammatory bowel disease, recurrent pancreatitis, gastritis or peptic ulcer disease.  Hdrocodone as directed for pain To ER if severe pain or bleeding

## 2012-06-01 NOTE — Progress Notes (Signed)
Faxed to PCP

## 2012-06-01 NOTE — Telephone Encounter (Signed)
I called [name] and went over the lab orders and stool sample orders with her. She is aware that [name] will call about scheduling the procedures.

## 2012-06-01 NOTE — Assessment & Plan Note (Signed)
see bloody diarrhea

## 2012-06-01 NOTE — Assessment & Plan Note (Signed)
See diarrhea. 

## 2012-06-01 NOTE — Telephone Encounter (Signed)
Patient is scheduled with [facility] on 02/25 at 10:30 I have mailed instructions and Ms. Claybrooks is aware

## 2012-06-02 LAB — COMPREHENSIVE METABOLIC PANEL
ALT: 12 U/L (ref 0–53)
CO2: 23 mEq/L (ref 19–32)
Calcium: 9.1 mg/dL (ref 8.4–10.5)
Chloride: 106 mEq/L (ref 96–112)
Potassium: 4.7 mEq/L (ref 3.5–5.3)
Sodium: 137 mEq/L (ref 135–145)
Total Protein: 7.5 g/dL (ref 6.0–8.3)

## 2012-06-02 LAB — CBC WITH DIFFERENTIAL/PLATELET
Lymphocytes Relative: 36 % (ref 12–46)
Lymphs Abs: 2.3 10*3/uL (ref 0.7–4.0)
MCV: 84.2 fL (ref 78.0–100.0)
Neutrophils Relative %: 54 % (ref 43–77)
Platelets: 148 10*3/uL — ABNORMAL LOW (ref 150–400)
RBC: 4.69 MIL/uL (ref 4.22–5.81)
WBC: 6.2 10*3/uL (ref 4.0–10.5)

## 2012-06-02 LAB — LIPASE: Lipase: 29 U/L (ref 0–75)

## 2012-06-03 LAB — CLOSTRIDIUM DIFFICILE BY PCR: Toxigenic C. Difficile by PCR: NOT DETECTED

## 2012-06-03 LAB — FECAL LACTOFERRIN, QUANT: Lactoferrin: POSITIVE

## 2012-06-03 NOTE — Progress Notes (Signed)
Quick Note:  Await stools ______ 

## 2012-06-06 ENCOUNTER — Telehealth: Payer: Self-pay | Admitting: Urgent Care

## 2012-06-06 LAB — STOOL CULTURE

## 2012-06-06 MED ORDER — METRONIDAZOLE 500 MG PO TABS: 500.0000 mg | ORAL_TABLET | Freq: Two times a day (BID) | ORAL | Status: AC

## 2012-06-06 MED ORDER — SODIUM CHLORIDE 0.45 % IV SOLN: INTRAVENOUS | Status: DC

## 2012-06-06 NOTE — Progress Notes (Signed)
Quick Note:  See phone note ZO:XWRUE, Lyda Perone, MD  ______

## 2012-06-06 NOTE — Telephone Encounter (Signed)
Patient has been R/S w/[facility] on 3/25  at 9:30 and [name] is aware [facility] is on PAL the week of the 17th and will be back on the 25th

## 2012-06-06 NOTE — Telephone Encounter (Signed)
Spoke w/ Dr Darrick Penna, pt & Cain Sieve (pt's caregiver) Results given Pt will need FLAGYL 500mg  BID x1 week for giardia Lactose free diet for 1 month Cancel colonoscopy for tomorrow Please reschedule for 2 weeks after treatment (week of 17th) w/ Dr Darrick Penna with same orders as before All parties agreeable Thanks

## 2012-06-07 NOTE — Progress Notes (Signed)
Faxed to PCP

## 2012-07-05 ENCOUNTER — Telehealth: Payer: Self-pay

## 2012-07-05 ENCOUNTER — Encounter (HOSPITAL_COMMUNITY)
Admission: RE | Disposition: A | Discharge status: Home / Self Care | Payer: Self-pay | Source: Ambulatory Visit | Attending: Gastroenterology

## 2012-07-05 ENCOUNTER — Ambulatory Visit (HOSPITAL_COMMUNITY)
Admission: RE | Admit: 2012-07-05 | Discharge: 2012-07-05 | Disposition: A | Discharge status: Home / Self Care | Payer: Medicare Other | Source: Ambulatory Visit | Attending: Gastroenterology | Admitting: Gastroenterology

## 2012-07-05 ENCOUNTER — Encounter (HOSPITAL_COMMUNITY): Payer: Self-pay | Admitting: *Deleted

## 2012-07-05 DIAGNOSIS — K648 Other hemorrhoids: Secondary | ICD-10-CM | POA: Insufficient documentation

## 2012-07-05 DIAGNOSIS — K861 Other chronic pancreatitis: Secondary | ICD-10-CM | POA: Insufficient documentation

## 2012-07-05 DIAGNOSIS — Z860100 Personal history of colon polyps, unspecified: Secondary | ICD-10-CM | POA: Insufficient documentation

## 2012-07-05 DIAGNOSIS — K620 Anal polyp: Secondary | ICD-10-CM | POA: Insufficient documentation

## 2012-07-05 DIAGNOSIS — K625 Hemorrhage of anus and rectum: Secondary | ICD-10-CM

## 2012-07-05 DIAGNOSIS — K621 Rectal polyp: Secondary | ICD-10-CM

## 2012-07-05 DIAGNOSIS — E119 Type 2 diabetes mellitus without complications: Secondary | ICD-10-CM | POA: Insufficient documentation

## 2012-07-05 DIAGNOSIS — E7800 Pure hypercholesterolemia, unspecified: Secondary | ICD-10-CM | POA: Insufficient documentation

## 2012-07-05 DIAGNOSIS — K5730 Diverticulosis of large intestine without perforation or abscess without bleeding: Secondary | ICD-10-CM

## 2012-07-05 DIAGNOSIS — D126 Benign neoplasm of colon, unspecified: Secondary | ICD-10-CM | POA: Insufficient documentation

## 2012-07-05 DIAGNOSIS — F17200 Nicotine dependence, unspecified, uncomplicated: Secondary | ICD-10-CM | POA: Insufficient documentation

## 2012-07-05 DIAGNOSIS — R197 Diarrhea, unspecified: Secondary | ICD-10-CM

## 2012-07-05 DIAGNOSIS — I10 Essential (primary) hypertension: Secondary | ICD-10-CM | POA: Insufficient documentation

## 2012-07-05 DIAGNOSIS — K921 Melena: Secondary | ICD-10-CM | POA: Insufficient documentation

## 2012-07-05 DIAGNOSIS — Z881 Allergy status to other antibiotic agents status: Secondary | ICD-10-CM | POA: Insufficient documentation

## 2012-07-05 DIAGNOSIS — Z91040 Latex allergy status: Secondary | ICD-10-CM | POA: Insufficient documentation

## 2012-07-05 DIAGNOSIS — R569 Unspecified convulsions: Secondary | ICD-10-CM | POA: Insufficient documentation

## 2012-07-05 HISTORY — PX: COLONOSCOPY: SHX5424

## 2012-07-05 HISTORY — DX: Hemiplegia, unspecified affecting unspecified side: G81.90

## 2012-07-05 SURGERY — COLONOSCOPY
Anesthesia: Moderate Sedation

## 2012-07-05 MED ORDER — SODIUM CHLORIDE 0.9 % IV SOLN
INTRAVENOUS | Status: DC
  Administered 2012-07-05: 09:00:00 via INTRAVENOUS

## 2012-07-05 MED ORDER — PANCRELIPASE (LIP-PROT-AMYL) 36000-114000 UNITS PO CPEP: 3.0000 | ORAL_CAPSULE | Freq: Three times a day (TID) | ORAL | Status: DC

## 2012-07-05 MED ORDER — PROMETHAZINE HCL 25 MG/ML IJ SOLN
INTRAMUSCULAR | Status: AC
  Filled 2012-07-05: qty 1

## 2012-07-05 MED ORDER — STERILE WATER FOR IRRIGATION IR SOLN
Status: DC | PRN
  Administered 2012-07-05: 10:00:00

## 2012-07-05 MED ORDER — MIDAZOLAM HCL 5 MG/5ML IJ SOLN
INTRAMUSCULAR | Status: DC | PRN
  Administered 2012-07-05: 2 mg via INTRAVENOUS
  Administered 2012-07-05: 1 mg via INTRAVENOUS

## 2012-07-05 MED ORDER — SODIUM CHLORIDE 0.9 % IJ SOLN
INTRAMUSCULAR | Status: AC
  Filled 2012-07-05: qty 10

## 2012-07-05 MED ORDER — MIDAZOLAM HCL 5 MG/5ML IJ SOLN
INTRAMUSCULAR | Status: AC
  Filled 2012-07-05: qty 10

## 2012-07-05 MED ORDER — MEPERIDINE HCL 100 MG/ML IJ SOLN
INTRAMUSCULAR | Status: DC | PRN
  Administered 2012-07-05 (×2): 25 mg via INTRAVENOUS

## 2012-07-05 MED ORDER — MEPERIDINE HCL 100 MG/ML IJ SOLN
INTRAMUSCULAR | Status: AC
  Filled 2012-07-05: qty 1

## 2012-07-05 NOTE — Telephone Encounter (Signed)
1 0r 2 with snacks [facility]

## 2012-07-05 NOTE — Op Note (Signed)
[facility] [address] [place], [address]   COLONOSCOPY PROCEDURE REPORT  PATIENT: [name], [name]  MR#: [id number] BIRTHDATE: 1960-09-04 , 52  yrs. old GENDER: Male ENDOSCOPIST: Jonette Eva, MD REFERRED XB:JYNW Loney Hering, M.D. PROCEDURE DATE:  07/05/2012 PROCEDURE:   Colonoscopy with random cold biopsy and cold biopsy polypectomy -ENTRADA OVERTUBE INDICATIONS:bloody diarrhea-PMHx: GIARDIASIS.  1-2 LOOSE/WATERY STOOLS/DAY AND NO CHANGE AFTER FLAGYL FOR 7 DAYS. MEDICATIONS: Demerol 50 mg IV and Versed 3 mg IV  DESCRIPTION OF PROCEDURE:    Physical exam was performed.  Informed consent was obtained from the patient after explaining the benefits, risks, and alternatives to procedure.  The patient was connected to monitor and placed in left lateral position. Continuous oxygen was provided by nasal cannula and IV medicine administered through an indwelling cannula.  After administration of sedation and rectal exam, the patients rectum was intubated and the EC-3890Li ([id number])  colonoscope was advanced under direct visualization to the ileum.  The scope was removed slowly by carefully examining the color, texture, anatomy, and integrity mucosa on the way out.  The patient was recovered in endoscopy and discharged home in satisfactory condition.    COLON FINDINGS: The mucosa appeared normal in the terminal ileum.  10-15 CM VISUALIZED. Moderate diverticulosis was noted throughout the entire examined colon.  , Five sessile polyps measuring 2-3 mm in size were found in the sigmoid colon and rectum.  A polypectomy was performed with cold forceps.  RANDOM BIOPSIES OBTAINED VIA COLD FORCEPS TO EVALUATE FOR MICROSCOPIC COLITIS. , and Small internal hemorrhoids were found.  PREP QUALITY: good. CECAL W/D TIME: 22 minutes COMPLICATIONS: None  ENDOSCOPIC IMPRESSION: 1.   Moderate diverticulosis was noted throughout the entire examined colon 2.   Five sessile polyps in  the sigmoid colon and rectum 3.   Small internal hemorrhoids-MOST LIKELY CAUSE FOR RECTAL BLEEDING 4.  DIARRHEA/ABDOMINAL PAIN MOST LIKELY DUE TO CHRONIC PANCREATITIS  RECOMMENDATIONS: FOLLOW A LOW FAT/HIGH FIBER DIET.  AVOID ITEMS THAT CAUSE BLOATING.  START CREON 36K 3 WITH MEALS AND 1 OR 2 WITH SNACKS. BIOPSY RESULTS SHOULD BE BACK IN 7 DAYS. Next colonoscopy in 5 years WITH ENTRADA OVERTUBE/2 DAY BOWEL PREP(MIRALAX/MOVIPREP). FOLLOW UP IN 4 MOS.       _______________________________ Rosalie DoctorJonette Eva, MD 07/05/2012 2:54 PM     PATIENT NAME:  [name] MR#: [id number]

## 2012-07-05 NOTE — Telephone Encounter (Signed)
Received a fax from [employer]. Information requested on the Creon:  Be specific: one or two capsules with snacks daily and also is this once or twice daily?
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
## 2012-07-05 NOTE — Telephone Encounter (Signed)
This is from [facility]  ( my error) not [employer] and they package in bubble packs.  They need to know One or Two to package for snacks.  Thanks!

## 2012-07-06 NOTE — Telephone Encounter (Signed)
1 WITH SNACKS TID

## 2012-07-06 NOTE — Telephone Encounter (Signed)
Faxed the info to [facility] at [phone]).

## 2012-07-08 ENCOUNTER — Encounter (HOSPITAL_COMMUNITY): Payer: Self-pay | Admitting: Gastroenterology

## 2012-07-15 ENCOUNTER — Telehealth: Payer: Self-pay | Admitting: Gastroenterology

## 2012-07-15 NOTE — Telephone Encounter (Signed)
Cc PCP 
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
## 2012-07-18 NOTE — Telephone Encounter (Signed)
Pt returned call and was informed of results.  

## 2012-07-18 NOTE — Telephone Encounter (Signed)
LMOM to call.

## 2012-07-19 NOTE — Telephone Encounter (Signed)
appt reminder made for 10/2012  nic recall tcs in 5 years

## 2012-09-15 NOTE — Progress Notes (Signed)
TCS MAR 2014 NL COLON BX, HYPERPLASTIC POLYPS   REVIEWED.

## 2012-10-05 ENCOUNTER — Telehealth: Payer: Self-pay | Admitting: Gastroenterology

## 2012-10-05 NOTE — Telephone Encounter (Signed)
REVIEWED.  

## 2012-10-05 NOTE — Telephone Encounter (Signed)
Pt received letter from recall list and his mother LMOM while I was out asking about OV. I called and spoke to pt's mother and told her that he was on recall list to follow up with SF in 4 months and it was time to get him scheduled for that. Patient is feeling fine and doesn't want OV at this time.

## 2012-12-05 ENCOUNTER — Telehealth: Payer: Self-pay

## 2012-12-05 NOTE — Telephone Encounter (Signed)
Fax received today saying that pt returned 3 months of Creon to [facility] not taken. They have contacted his social worker and wanted to make Dr. Darrick Penna aware.

## 2012-12-05 NOTE — Telephone Encounter (Signed)
REVIEWED.  

## 2013-04-10 ENCOUNTER — Telehealth: Payer: Self-pay | Admitting: *Deleted

## 2013-04-10 NOTE — Telephone Encounter (Signed)
Pt's wife called stating pt is having rectal bleeding, only when pt is going to the bathroom, I told her the next available will be the 22nd. Pt's don't want to wait that long, pt's wife wants to speak with the nurse to see about what they should do. I offered to go ahead and put pt on for the 22nd. But pt's wife said she wants to talk to the nurse first. Please advise [phone]

## 2013-04-10 NOTE — Telephone Encounter (Signed)
I called and spoke to the pt. ( he said he does not have a wife, it was probabely his CNA, Elmon Kirschner who has gone for the day. He said he saw just a spot of blood in his stool a couple of times last week and just a spot again this AM. I told him it sounds like it is OK to schedule appt for 1/22 and he said, no don't do it yet, he will have CNA call me in the morning when she gets there.

## 2013-04-10 NOTE — Telephone Encounter (Signed)
Given TCS 06/2012, ok to wait until first available appt unless he demonstrates significant bleeding.

## 2013-04-11 ENCOUNTER — Encounter: Payer: Self-pay | Admitting: Gastroenterology

## 2013-04-11 NOTE — Telephone Encounter (Signed)
Pt was mailed an appt card for OV on 1/27 at 11 with LSL

## 2013-04-11 NOTE — Telephone Encounter (Signed)
Tried to call again. Busy signal. Will have Darl Pikes give him an appt and mail a card.

## 2013-04-11 NOTE — Telephone Encounter (Signed)
Tried to call pt and busy.

## 2013-05-09 ENCOUNTER — Encounter: Payer: Self-pay | Admitting: Gastroenterology

## 2013-05-09 ENCOUNTER — Encounter (INDEPENDENT_AMBULATORY_CARE_PROVIDER_SITE_OTHER): Payer: Self-pay

## 2013-05-09 ENCOUNTER — Ambulatory Visit (INDEPENDENT_AMBULATORY_CARE_PROVIDER_SITE_OTHER): Payer: Medicare Other | Admitting: Gastroenterology

## 2013-05-09 VITALS — BP 122/66 | HR 70 | Temp 98.2°F

## 2013-05-09 DIAGNOSIS — R1013 Epigastric pain: Secondary | ICD-10-CM

## 2013-05-09 DIAGNOSIS — K859 Acute pancreatitis without necrosis or infection, unspecified: Secondary | ICD-10-CM

## 2013-05-09 MED ORDER — HYDROCODONE-ACETAMINOPHEN 5-500 MG PO TABS: 1.0000 | ORAL_TABLET | Freq: Four times a day (QID) | ORAL | Status: DC | PRN

## 2013-05-09 NOTE — Assessment & Plan Note (Signed)
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 Symptoms worse postprandially. History of alcoholic pancreatitis in 8721. Denies frequent alcohol use. Occasional shots but none to speak of lately. Denies heartburn. History of H. pylori in the past status post treatment. He brings in a list of his medication printed by his pharmacy that shows he is taking his Creon and omeprazole. Given history of pancreatitis in the past, questionable reliability of his current history is he is a difficult historian, we'll go ahead and do labs and CT scan for further evaluation of his pain. We'll evaluate for possible chronic pancreatitis the same time. Limited supply of hydrocodone for acute pain provided.  Rectal bleeding back in December likely hemorrhoids versus diverticular. Updated colonoscopy in March 2014 is reassuring. Her further management at this time unless he has recurrent bleeding.

## 2013-05-09 NOTE — Progress Notes (Signed)
cc'd to pcp 

## 2013-05-09 NOTE — Progress Notes (Signed)
Primary Care Physician: [name], MD  Primary Gastroenterologist:  Barney Drain, MD   Chief Complaint  Patient presents with  . Abdominal Pain  . Rectal Bleeding    HPI: [name] is a 53 y.o. male here for further evaluation of bloody stool and abdominal pain. H/O alcoholic [id number]), H. pylori gastritis (2010 status post treatment). Underwent colonoscopy March 2014. He had moderate diverticulosis throughout the entire examined colon. Terminal ileum was normal. Multiple polyps removed (hyperplastic). Small internal hemorrhoids felt to be the source of rectal bleeding. Random biopsies taken and were negative for microscopic colitis.  He called prior to Christmas with complaints of 3 episodes of moderate volume rectal bleeding. No further bleeding since that time. Bleeding was painless. No complaints with bowel movements at this time. Has been on Creon for possible chronic pancreatitis. Presents today complaining of epigastric pain for 3 days. States is hard to explain what the pain feels like that he has nausea without vomiting. Sometimes worse with meals. No radiation into his back. Denies heartburn. Denies chronic abdominal pain. No melena.    Current Outpatient Prescriptions  Medication Sig Dispense Refill  . cetirizine (ZYRTEC) 5 MG tablet Take 5 mg by mouth daily.      . hydrochlorothiazide 25 MG tablet Take 25 mg by mouth daily.        Marland Kitchen levETIRAcetam (KEPPRA) 1000 MG tablet Take 1,000 mg by mouth 2 (two) times daily.        Marland Kitchen losartan (COZAAR) 100 MG tablet Take 100 mg by mouth daily.        . metoprolol succinate (TOPROL-XL) 50 MG 24 hr tablet Take 50 mg by mouth daily. Take with or immediately following a meal.      . omeprazole (PRILOSEC) 20 MG capsule Take 20 mg by mouth daily.        . Pancrelipase, Lip-Prot-Amyl, (CREON) 36000 UNITS CPEP Take 3 capsules by mouth 3 (three) times daily with meals. TAKE 1 OR 2 WITH SNACKS  500 capsule  11  . PHENobarbital  (LUMINAL) 97.2 MG tablet Take 97.2 mg by mouth 2 (two) times daily.        . pravastatin (PRAVACHOL) 20 MG tablet Take 20 mg by mouth daily.         No current facility-administered medications for this visit.    Allergies as of 05/09/2013 - Review Complete 05/09/2013  Allergen Reaction Noted  . Cephalexin Hives 11/20/2010  . Latex Hives 11/20/2010    ROS:  General: Negative for anorexia, weight loss, fever, chills, fatigue, weakness. ENT: Negative for hoarseness, difficulty swallowing , nasal congestion. CV: Negative for chest pain, angina, palpitations, dyspnea on exertion, peripheral edema.  Respiratory: Negative for dyspnea at rest, dyspnea on exertion, cough, sputum, wheezing.  GI: See history of present illness. GU:  Negative for dysuria, hematuria, urinary incontinence, urinary frequency, nocturnal urination.  Endo: Negative for unusual weight change.    Physical Examination:   BP 122/66  Pulse 70  Temp(Src) 98.2 F (36.8 C) (Oral)  General: Well-nourished, well-developed in no acute distress.  Eyes: No icterus. Mouth: Oropharyngeal mucosa moist and pink , no lesions erythema or exudate. Lungs: Clear to auscultation bilaterally.  Heart: Regular rate and rhythm, no murmurs rubs or gallops.  Abdomen: Bowel sounds are normal, moderate epigastric tenderness. Abdomen is nondistended. Exam limited as patient is in a wheelchair.  no abdominal bruits or hernia , no rebound or guarding.   Extremities: No lower extremity edema.  No clubbing or deformities. Neuro: Alert and oriented x 4   Skin: Warm and dry, no jaundice.   Psych: Alert and cooperative, normal mood and affect.

## 2013-05-09 NOTE — Patient Instructions (Signed)
1. Please have your labs done. 2. CT scan as scheduled. 3. Prescription for pain medications provided for acute pain.

## 2013-05-12 ENCOUNTER — Other Ambulatory Visit (HOSPITAL_COMMUNITY): Payer: Medicare Other

## 2013-05-17 ENCOUNTER — Ambulatory Visit (HOSPITAL_COMMUNITY)
Admission: RE | Admit: 2013-05-17 | Discharge: 2013-05-17 | Disposition: A | Discharge status: Home / Self Care | Payer: Medicare Other | Source: Ambulatory Visit | Attending: Gastroenterology | Admitting: Gastroenterology

## 2013-05-17 DIAGNOSIS — I10 Essential (primary) hypertension: Secondary | ICD-10-CM | POA: Insufficient documentation

## 2013-05-17 DIAGNOSIS — R1013 Epigastric pain: Secondary | ICD-10-CM

## 2013-05-17 DIAGNOSIS — K869 Disease of pancreas, unspecified: Secondary | ICD-10-CM | POA: Insufficient documentation

## 2013-05-17 DIAGNOSIS — K859 Acute pancreatitis without necrosis or infection, unspecified: Secondary | ICD-10-CM

## 2013-05-17 DIAGNOSIS — R109 Unspecified abdominal pain: Secondary | ICD-10-CM | POA: Insufficient documentation

## 2013-05-17 DIAGNOSIS — E119 Type 2 diabetes mellitus without complications: Secondary | ICD-10-CM | POA: Insufficient documentation

## 2013-05-17 DIAGNOSIS — K4090 Unilateral inguinal hernia, without obstruction or gangrene, not specified as recurrent: Secondary | ICD-10-CM | POA: Insufficient documentation

## 2013-05-17 LAB — POCT I-STAT CREATININE: CREATININE: 1.4 mg/dL — AB (ref 0.50–1.35)

## 2013-05-17 MED ORDER — SODIUM CHLORIDE 0.9 % IJ SOLN
INTRAMUSCULAR | Status: AC
  Filled 2013-05-17: qty 500

## 2013-05-17 MED ORDER — IOHEXOL 300 MG/ML  SOLN
100.0000 mL | Freq: Once | INTRAMUSCULAR | Status: AC | PRN
  Administered 2013-05-17: 100 mL via INTRAVENOUS

## 2013-05-17 NOTE — Progress Notes (Signed)
Quick Note:  Called and spoke to pt and CNA, Kim. She is aware of where to take pt for his labs. ______

## 2013-05-17 NOTE — Progress Notes (Signed)
Quick Note:  Send copy of Creatinine to PCP. ______

## 2013-05-18 LAB — CBC WITH DIFFERENTIAL/PLATELET
BASOS ABS: 0 10*3/uL (ref 0.0–0.1)
Basophils Relative: 0 % (ref 0–1)
EOS PCT: 2 % (ref 0–5)
Eosinophils Absolute: 0.2 10*3/uL (ref 0.0–0.7)
HEMATOCRIT: 44.4 % (ref 39.0–52.0)
HEMOGLOBIN: 15.1 g/dL (ref 13.0–17.0)
LYMPHS ABS: 2.2 10*3/uL (ref 0.7–4.0)
LYMPHS PCT: 24 % (ref 12–46)
MCH: 29.1 pg (ref 26.0–34.0)
MCHC: 34 g/dL (ref 30.0–36.0)
MCV: 85.5 fL (ref 78.0–100.0)
MONO ABS: 0.7 10*3/uL (ref 0.1–1.0)
Monocytes Relative: 8 % (ref 3–12)
NEUTROS ABS: 5.9 10*3/uL (ref 1.7–7.7)
Neutrophils Relative %: 66 % (ref 43–77)
Platelets: 162 10*3/uL (ref 150–400)
RBC: 5.19 MIL/uL (ref 4.22–5.81)
RDW: 14.2 % (ref 11.5–15.5)
WBC: 8.9 10*3/uL (ref 4.0–10.5)

## 2013-05-18 NOTE — Progress Notes (Signed)
Quick Note:  I called back to radiology just now, many rings and no answer. ______

## 2013-05-18 NOTE — Progress Notes (Signed)
Faxed copy to Dr. Wenda Overland pt's PCP
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
## 2013-05-19 LAB — COMPREHENSIVE METABOLIC PANEL
ALBUMIN: 4.6 g/dL (ref 3.5–5.2)
ALT: 21 U/L (ref 0–53)
AST: 15 U/L (ref 0–37)
Alkaline Phosphatase: 149 U/L — ABNORMAL HIGH (ref 39–117)
BUN: 26 mg/dL — AB (ref 6–23)
CALCIUM: 9.5 mg/dL (ref 8.4–10.5)
CHLORIDE: 100 meq/L (ref 96–112)
CO2: 27 meq/L (ref 19–32)
CREATININE: 1.39 mg/dL — AB (ref 0.50–1.35)
Glucose, Bld: 130 mg/dL — ABNORMAL HIGH (ref 70–99)
POTASSIUM: 4.4 meq/L (ref 3.5–5.3)
Sodium: 137 mEq/L (ref 135–145)
Total Bilirubin: 0.4 mg/dL (ref 0.2–1.2)
Total Protein: 8.3 g/dL (ref 6.0–8.3)

## 2013-05-19 LAB — LIPASE: LIPASE: 83 U/L — AB (ref 0–75)
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
## 2013-05-22 ENCOUNTER — Other Ambulatory Visit: Payer: Self-pay | Admitting: Gastroenterology

## 2013-05-22 DIAGNOSIS — K8689 Other specified diseases of pancreas: Secondary | ICD-10-CM

## 2013-05-22 NOTE — Progress Notes (Signed)
MRI Scheduled for Friday Feb 13th at 8:00 am NPO after midnight and patient is aware

## 2013-05-22 NOTE — Progress Notes (Signed)
Ordered entered for MRI. Darius Bump can you please schedule.

## 2013-05-26 ENCOUNTER — Telehealth: Payer: Self-pay | Admitting: Gastroenterology

## 2013-05-26 ENCOUNTER — Inpatient Hospital Stay (HOSPITAL_COMMUNITY): Admission: RE | Admit: 2013-05-26 | Payer: Medicare Other | Source: Ambulatory Visit
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
## 2013-05-29 ENCOUNTER — Other Ambulatory Visit: Payer: Self-pay | Admitting: Gastroenterology

## 2013-05-29 ENCOUNTER — Other Ambulatory Visit: Payer: Self-pay

## 2013-05-29 DIAGNOSIS — R945 Abnormal results of liver function studies: Secondary | ICD-10-CM

## 2013-05-29 DIAGNOSIS — Q453 Other congenital malformations of pancreas and pancreatic duct: Secondary | ICD-10-CM

## 2013-05-29 DIAGNOSIS — R7989 Other specified abnormal findings of blood chemistry: Secondary | ICD-10-CM

## 2013-05-29 NOTE — Telephone Encounter (Signed)
Referral has been made to Dr. Ardis Hughs

## 2013-05-29 NOTE — Telephone Encounter (Signed)
Waiting for response from RMR to see if EUS recommended since patient not candidate for MRI.

## 2013-05-29 NOTE — Telephone Encounter (Signed)
Pt is aware and his friend also.

## 2013-05-29 NOTE — Progress Notes (Signed)
Quick Note:  Pt aware and lab order mailed to do lab in 4 weeks. ______
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
## 2013-05-31 ENCOUNTER — Telehealth: Payer: Self-pay

## 2013-05-31 DIAGNOSIS — R932 Abnormal findings on diagnostic imaging of liver and biliary tract: Secondary | ICD-10-CM
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
## 2013-06-02 ENCOUNTER — Encounter (HOSPITAL_COMMUNITY): Payer: Self-pay | Admitting: *Deleted

## 2013-06-02 ENCOUNTER — Other Ambulatory Visit: Payer: Self-pay

## 2013-06-02 DIAGNOSIS — R932 Abnormal findings on diagnostic imaging of liver and biliary tract: Secondary | ICD-10-CM

## 2013-06-02 NOTE — Telephone Encounter (Signed)
EUS 06/08/13 830 am needs instruction

## 2013-06-02 NOTE — Telephone Encounter (Signed)
Pt has been notified and instructions mailed to the home allergies and meds were reviewed

## 2013-06-07 ENCOUNTER — Telehealth: Payer: Self-pay | Admitting: Gastroenterology

## 2013-06-07 ENCOUNTER — Other Ambulatory Visit: Payer: Self-pay

## 2013-06-07 ENCOUNTER — Encounter (HOSPITAL_COMMUNITY): Payer: Self-pay | Admitting: Pharmacy Technician

## 2013-06-07 DIAGNOSIS — R932 Abnormal findings on diagnostic imaging of liver and biliary tract: Secondary | ICD-10-CM

## 2013-06-07 NOTE — Telephone Encounter (Signed)
EUS changed to 06/15/13 1115 am EUS scheduled, pt instructed and medications reviewed.  Patient instructions mailed to home.  Patient to call with any questions or concerns.
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
## 2013-06-08 ENCOUNTER — Ambulatory Visit (HOSPITAL_COMMUNITY): Admission: RE | Admit: 2013-06-08 | Payer: Medicare Other | Source: Ambulatory Visit | Admitting: Gastroenterology

## 2013-06-08 SURGERY — UPPER ENDOSCOPIC ULTRASOUND (EUS) LINEAR
Anesthesia: Monitor Anesthesia Care

## 2013-06-09 NOTE — Telephone Encounter (Signed)
Appt was R/S w/Dr. Ardis Hughs

## 2013-06-15 ENCOUNTER — Ambulatory Visit (HOSPITAL_COMMUNITY)
Admission: RE | Admit: 2013-06-15 | Discharge: 2013-06-15 | Disposition: A | Discharge status: Home / Self Care | Payer: Medicare Other | Source: Ambulatory Visit | Attending: Gastroenterology | Admitting: Gastroenterology

## 2013-06-15 ENCOUNTER — Encounter (HOSPITAL_COMMUNITY): Payer: Self-pay | Admitting: *Deleted

## 2013-06-15 ENCOUNTER — Ambulatory Visit (HOSPITAL_COMMUNITY): Payer: Medicare Other | Admitting: Anesthesiology

## 2013-06-15 ENCOUNTER — Encounter (HOSPITAL_COMMUNITY)
Admission: RE | Disposition: A | Discharge status: Home / Self Care | Payer: Self-pay | Source: Ambulatory Visit | Attending: Gastroenterology

## 2013-06-15 ENCOUNTER — Encounter (HOSPITAL_COMMUNITY): Payer: Medicare Other | Admitting: Anesthesiology

## 2013-06-15 DIAGNOSIS — R932 Abnormal findings on diagnostic imaging of liver and biliary tract: Secondary | ICD-10-CM | POA: Insufficient documentation

## 2013-06-15 DIAGNOSIS — A048 Other specified bacterial intestinal infections: Secondary | ICD-10-CM | POA: Insufficient documentation

## 2013-06-15 DIAGNOSIS — Z860100 Personal history of colon polyps, unspecified: Secondary | ICD-10-CM | POA: Insufficient documentation

## 2013-06-15 DIAGNOSIS — E119 Type 2 diabetes mellitus without complications: Secondary | ICD-10-CM | POA: Insufficient documentation

## 2013-06-15 DIAGNOSIS — E7800 Pure hypercholesterolemia, unspecified: Secondary | ICD-10-CM | POA: Insufficient documentation

## 2013-06-15 DIAGNOSIS — K869 Disease of pancreas, unspecified: Secondary | ICD-10-CM | POA: Insufficient documentation

## 2013-06-15 DIAGNOSIS — I10 Essential (primary) hypertension: Secondary | ICD-10-CM | POA: Insufficient documentation

## 2013-06-15 DIAGNOSIS — R1013 Epigastric pain: Secondary | ICD-10-CM | POA: Insufficient documentation

## 2013-06-15 HISTORY — PX: EUS: SHX5427

## 2013-06-15 LAB — GLUCOSE, CAPILLARY: Glucose-Capillary: 111 mg/dL — ABNORMAL HIGH (ref 70–99)

## 2013-06-15 SURGERY — UPPER ENDOSCOPIC ULTRASOUND (EUS) LINEAR
Anesthesia: Monitor Anesthesia Care

## 2013-06-15 MED ORDER — PROPOFOL 10 MG/ML IV BOLUS
INTRAVENOUS | Status: AC
  Filled 2013-06-15: qty 20

## 2013-06-15 MED ORDER — PROPOFOL 10 MG/ML IV BOLUS
INTRAVENOUS | Status: DC | PRN
  Administered 2013-06-15 (×5): 20 mg via INTRAVENOUS

## 2013-06-15 MED ORDER — PROPOFOL INFUSION 10 MG/ML OPTIME
INTRAVENOUS | Status: DC | PRN
  Administered 2013-06-15: 120 ug/kg/min via INTRAVENOUS

## 2013-06-15 MED ORDER — SODIUM CHLORIDE 0.9 % IV SOLN: INTRAVENOUS | Status: DC

## 2013-06-15 MED ORDER — LACTATED RINGERS IV SOLN
INTRAVENOUS | Status: DC
  Administered 2013-06-15: 1000 mL via INTRAVENOUS

## 2013-06-15 MED ORDER — SODIUM CHLORIDE 0.9 % IV SOLN
INTRAVENOUS | Status: DC
Start: 2013-06-15 — End: 2013-06-15

## 2013-06-15 NOTE — Anesthesia Preprocedure Evaluation (Signed)
Anesthesia Evaluation  Patient identified by MRN, date of birth, ID band Patient awake    Reviewed: Allergy & Precautions, H&P , NPO status , Patient's Chart, lab work & pertinent test results  Airway Mallampati: II TM Distance: >3 FB Neck ROM: Limited    Dental no notable dental hx.    Pulmonary Current Smoker,  breath sounds clear to auscultation  Pulmonary exam normal       Cardiovascular hypertension, Pt. on medications and Pt. on home beta blockers Rhythm:Regular Rate:Normal     Neuro/Psych CVA, Residual Symptoms negative psych ROS   GI/Hepatic negative GI ROS, Neg liver ROS,   Endo/Other  diabetes  Renal/GU negative Renal ROS  negative genitourinary   Musculoskeletal negative musculoskeletal ROS (+)   Abdominal   Peds negative pediatric ROS (+)  Hematology negative hematology ROS (+)   Anesthesia Other Findings   Reproductive/Obstetrics negative OB ROS                           Anesthesia Physical Anesthesia Plan  ASA: III  Anesthesia Plan: MAC   Post-op Pain Management:    Induction: Intravenous  Airway Management Planned: Nasal Cannula  Additional Equipment:   Intra-op Plan:   Post-operative Plan:   Informed Consent: I have reviewed the patients History and Physical, chart, labs and discussed the procedure including the risks, benefits and alternatives for the proposed anesthesia with the patient or authorized representative who has indicated his/her understanding and acceptance.   Dental advisory given  Plan Discussed with: CRNA and Surgeon  Anesthesia Plan Comments:         Anesthesia Quick Evaluation
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
## 2013-06-15 NOTE — Op Note (Signed)
[facility] [place], [address]   ENDOSCOPIC ULTRASOUND PROCEDURE REPORT  PATIENT: Craig, [name]  MR#: [id number] BIRTHDATE: 1960/10/13  GENDER: Male ENDOSCOPIST: Milus Banister, MD REFERRED BY:  Barney Drain, M.D. PROCEDURE DATE:  06/15/2013 PROCEDURE:   Upper EUS ASA CLASS:      Class III INDICATIONS:   1.  remote etoh related pancreatitis; recent epigastric pain, CT scan suggested 45mm lesion in uncinate pancreas; no weight loss, patient today tells me he has not been having abdominal pain. MEDICATIONS: MAC sedation, administered by CRNA  DESCRIPTION OF PROCEDURE:   After the risks benefits and alternatives of the procedure were  explained, informed consent was obtained. The patient was then placed in the left, lateral, decubitus postion and IV sedation was administered. Throughout the procedure, the patients blood pressure, pulse and oxygen saturations were monitored continuously.  Under direct visualization, the Pentax Radial EUS [id number]  endoscope was introduced through the mouth  and advanced to the second portion of the duodenum .  Water was used as necessary to provide an acoustic interface.  Upon completion of the imaging, water was removed and the patient was sent to the recovery room in satisfactory condition.   Endoscopic findings (limited views with radial echoendoscope): 1. Normal UGI tract  EUS findings; 1. Pancreatic parenchyma was diffusely abnormal; more hypoechoic than usual. There were some scattered hyperchoic strands and the tail of pancreas appeared more overtly honeycombed.  There were no discrete masses or lesions in pancreas. 2. Main pancreatic duct was normal. 3. CBD was normal, non-dilated and contained no filling defects 4. No peripancreatic adenopathy 5. Limited views of liver, spleen, portal and splenic vessels were all normal  Impression: Diffuse pancreatic parenchymal changes, could be "early or  mild" chronic pancreatitis. No discrete masses or tumors.  _______________________________ eSignedMilus Banister, MD 06/15/2013 12:08 PM

## 2013-06-15 NOTE — Anesthesia Postprocedure Evaluation (Signed)
  Anesthesia Post-op Note  Patient: [name]  Procedure(s) Performed: Procedure(s) (LRB): UPPER ENDOSCOPIC ULTRASOUND (EUS) LINEAR (N/A)  Patient Location: PACU  Anesthesia Type: MAC  Level of Consciousness: awake and alert   Airway and Oxygen Therapy: Patient Spontanous Breathing  Post-op Pain: mild  Post-op Assessment: Post-op Vital signs reviewed, Patient's Cardiovascular Status Stable, Respiratory Function Stable, Patent Airway and No signs of Nausea or vomiting  Last Vitals:  Filed Vitals:   06/15/13 1220  BP: 180/98  Pulse:   Temp:   Resp: 24    Post-op Vital Signs: stable   Complications: No apparent anesthesia complications

## 2013-06-15 NOTE — Discharge Instructions (Signed)

## 2013-06-15 NOTE — Preoperative (Signed)
Beta Blockers   Reason not to administer Beta Blockers:Hold beta blocker due to other  Took Metoprolol last pm at 2200.

## 2013-06-15 NOTE — Transfer of Care (Signed)
Immediate Anesthesia Transfer of Care Note  Patient: [name]  Procedure(s) Performed: Procedure(s) (LRB): UPPER ENDOSCOPIC ULTRASOUND (EUS) LINEAR (N/A)  Patient Location: PACU  Anesthesia Type: MAC  Level of Consciousness: sedated, patient cooperative and responds to stimulation  Airway & Oxygen Therapy: Patient Spontanous Breathing and Patient connected to face mask oxgen  Post-op Assessment: Report given to PACU RN and Post -op Vital signs reviewed and stable  Post vital signs: Reviewed and stable  Complications: No apparent anesthesia complications

## 2013-06-16 ENCOUNTER — Telehealth: Payer: Self-pay | Admitting: Gastroenterology

## 2013-06-16 NOTE — Telephone Encounter (Signed)
I called and spoke to caregiver, Harless Litten. She said pt got a letter in the mail with lab orders and she wanted to know when he should go to the lab to do them. I told her he could go anytime, he does not have to have an appointment for that.

## 2013-06-16 NOTE — Telephone Encounter (Signed)
Caregiver called to see if patient still needs to do blood work on the 16th. I told her that the nurse would check on that and call her back. [phone]

## 2013-06-19 ENCOUNTER — Encounter (HOSPITAL_COMMUNITY): Payer: Self-pay | Admitting: Gastroenterology

## 2013-06-26 LAB — COMPREHENSIVE METABOLIC PANEL
ALK PHOS: 126 U/L — AB (ref 39–117)
ALT: 10 U/L (ref 0–53)
AST: 11 U/L (ref 0–37)
Albumin: 3.9 g/dL (ref 3.5–5.2)
BUN: 17 mg/dL (ref 6–23)
CHLORIDE: 104 meq/L (ref 96–112)
CO2: 25 mEq/L (ref 19–32)
CREATININE: 1.08 mg/dL (ref 0.50–1.35)
Calcium: 8.9 mg/dL (ref 8.4–10.5)
Glucose, Bld: 122 mg/dL — ABNORMAL HIGH (ref 70–99)
POTASSIUM: 4.1 meq/L (ref 3.5–5.3)
Sodium: 138 mEq/L (ref 135–145)
Total Bilirubin: 0.3 mg/dL (ref 0.2–1.2)
Total Protein: 6.9 g/dL (ref 6.0–8.3)

## 2013-07-03 NOTE — Progress Notes (Signed)
Quick Note:  Called. Many rings and no answer. Mailed a letter to call for results. ______

## 2013-07-07 ENCOUNTER — Telehealth: Payer: Self-pay | Admitting: Gastroenterology

## 2013-07-07 NOTE — Telephone Encounter (Signed)
[place] CNA has returned your call and you can reach her on his home phone

## 2013-07-07 NOTE — Progress Notes (Signed)
Quick Note:  Called and informed pt and his CNA, Kim. Routing to [place] to schedule the next office visit. ______

## 2013-07-10 NOTE — Telephone Encounter (Signed)
See result note dated 07/07/2013. Pt and CNA was informed of results.

## 2013-08-01 ENCOUNTER — Telehealth: Payer: Self-pay

## 2013-08-01 MED ORDER — PANCRELIPASE (LIP-PROT-AMYL) 36000-114000 UNITS PO CPEP: 3.0000 | ORAL_CAPSULE | Freq: Three times a day (TID) | ORAL | Status: DC

## 2013-08-01 NOTE — Telephone Encounter (Signed)
[facility] sent a fax for pt on his Creon. They said that pt said he was to take one capsule daily.   I only see that he was to take 3 capsules by mouth three times daily with meals and one capsule by mouth 3 times daily with snacks.   Please advise with new order.

## 2013-08-01 NOTE — Telephone Encounter (Signed)
Noted. New RX done.

## 2013-08-01 NOTE — Telephone Encounter (Signed)
I called pt and someone said he was in the restroom. His aid, Maudie Mercury is not there either.  I told the person to have pt call me ASAP and it is about some of his meds, and he said he will have him call.

## 2013-08-01 NOTE — Telephone Encounter (Signed)
Patient should be on creon 3 with meals and 1 or 2 with snacks.  Please find out why patient thinks it is only 1 per day. Maybe pharmacy or patient misunderstood.

## 2013-08-01 NOTE — Telephone Encounter (Signed)
Pt returned call. He said that he has been taking 3 capsules with breakfast and if he has a snack after breakfast he takes one capsule.   He does not eat lunch.  He does not take any with supper/ evening meal.  And if he has a second snack, sometimes he takes one and sometimes he does not.  I explained to him that he should take 3 with a meal three times a day and 1-2 with each snack.  He seemed to understand and he asked me to call tomorrow morning and go over this with his CNA Maudie Mercury.    I called [facility] and spoke to [place]. She said that pt has not been taking the monthly amount and he returns them.   I told her I have went over the instructions with the pt.  She said she does need new prescription to fill it again.

## 2013-08-02 NOTE — Telephone Encounter (Signed)
Sheppard Evens called back this morning but Tamela Oddi was tied up with a patient. She said you could call her back.

## 2013-08-02 NOTE — Telephone Encounter (Signed)
I called [name] at the pharmacy. She said they received the new order for the Creon. But they have to know how many snacks a day to count on and either one or two capsules with snacks because the meds are bubble packed.  I called the pt again. He said he only eats one meal a day and that is about 10:00 pm. Example: tonight he will have fried chicken and mashed potatoes and peas.  He has several snacks a day, no certain number, like potato chips or crackers, just whatever he decides he wants.   Please advise what I need to tell pharmacist so they can bubble pack.

## 2013-08-02 NOTE — Telephone Encounter (Signed)
I called and spoke to Maudie Mercury, CNA and explained how pt is to take the Creon and she expressed understanding.

## 2013-08-02 NOTE — Telephone Encounter (Signed)
I received 2 more faxes from [facility] with questions about the pt's Creon. I had talked to Gi Or Norman yesterday and told her how pt is to be taking it. Neil Crouch, PA , sent in a new prescription.  I called [facility] this morning to ask if they had any more questions or if the faxes came before that issue was resolved.  Sheppard Evens was busy, and I left message for her to return the call.

## 2013-08-03 MED ORDER — PANCRELIPASE (LIP-PROT-AMYL) 36000-114000 UNITS PO CPEP: 3.0000 | ORAL_CAPSULE | Freq: Three times a day (TID) | ORAL | Status: DC

## 2013-08-03 NOTE — Addendum Note (Signed)
Addended by: Mahala Menghini on: 08/03/2013 01:09 PM   Modules accepted: Orders

## 2013-12-20 ENCOUNTER — Encounter: Payer: Self-pay | Admitting: Gastroenterology

## 2014-06-13 ENCOUNTER — Encounter (HOSPITAL_COMMUNITY): Payer: Self-pay | Admitting: Emergency Medicine

## 2014-06-13 ENCOUNTER — Emergency Department (HOSPITAL_COMMUNITY)
Admission: EM | Admit: 2014-06-13 | Discharge: 2014-06-13 | Disposition: A | Discharge status: Home / Self Care | Payer: Medicare Other | Attending: Emergency Medicine | Admitting: Emergency Medicine

## 2014-06-13 ENCOUNTER — Emergency Department (HOSPITAL_COMMUNITY): Payer: Medicare Other

## 2014-06-13 DIAGNOSIS — Z720 Tobacco use: Secondary | ICD-10-CM | POA: Insufficient documentation

## 2014-06-13 DIAGNOSIS — Y998 Other external cause status: Secondary | ICD-10-CM | POA: Insufficient documentation

## 2014-06-13 DIAGNOSIS — E119 Type 2 diabetes mellitus without complications: Secondary | ICD-10-CM | POA: Diagnosis not present

## 2014-06-13 DIAGNOSIS — Y92002 Bathroom of unspecified non-institutional (private) residence single-family (private) house as the place of occurrence of the external cause: Secondary | ICD-10-CM | POA: Diagnosis not present

## 2014-06-13 DIAGNOSIS — E780 Pure hypercholesterolemia: Secondary | ICD-10-CM | POA: Insufficient documentation

## 2014-06-13 DIAGNOSIS — I10 Essential (primary) hypertension: Secondary | ICD-10-CM | POA: Diagnosis not present

## 2014-06-13 DIAGNOSIS — Z79899 Other long term (current) drug therapy: Secondary | ICD-10-CM | POA: Diagnosis not present

## 2014-06-13 DIAGNOSIS — W050XXA Fall from non-moving wheelchair, initial encounter: Secondary | ICD-10-CM | POA: Diagnosis not present

## 2014-06-13 DIAGNOSIS — G40909 Epilepsy, unspecified, not intractable, without status epilepticus: Secondary | ICD-10-CM | POA: Diagnosis not present

## 2014-06-13 DIAGNOSIS — Z043 Encounter for examination and observation following other accident: Secondary | ICD-10-CM | POA: Diagnosis not present

## 2014-06-13 DIAGNOSIS — W19XXXA Unspecified fall, initial encounter: Secondary | ICD-10-CM

## 2014-06-13 DIAGNOSIS — Y92009 Unspecified place in unspecified non-institutional (private) residence as the place of occurrence of the external cause: Secondary | ICD-10-CM

## 2014-06-13 DIAGNOSIS — Z8719 Personal history of other diseases of the digestive system: Secondary | ICD-10-CM | POA: Insufficient documentation

## 2014-06-13 DIAGNOSIS — Y9389 Activity, other specified: Secondary | ICD-10-CM | POA: Diagnosis not present

## 2014-06-13 DIAGNOSIS — Z91040 Latex allergy status: Secondary | ICD-10-CM | POA: Diagnosis not present

## 2014-06-13 DIAGNOSIS — Z8619 Personal history of other infectious and parasitic diseases: Secondary | ICD-10-CM | POA: Insufficient documentation

## 2014-06-13 LAB — I-STAT CHEM 8, ED
BUN: 19 mg/dL (ref 6–23)
CREATININE: 0.9 mg/dL (ref 0.50–1.35)
Calcium, Ion: 1.13 mmol/L (ref 1.12–1.23)
Chloride: 105 mmol/L (ref 96–112)
Glucose, Bld: 210 mg/dL — ABNORMAL HIGH (ref 70–99)
HCT: 46 % (ref 39.0–52.0)
HEMOGLOBIN: 15.6 g/dL (ref 13.0–17.0)
POTASSIUM: 3.9 mmol/L (ref 3.5–5.1)
SODIUM: 142 mmol/L (ref 135–145)
TCO2: 24 mmol/L (ref 0–100)

## 2014-06-13 LAB — PHENOBARBITAL LEVEL: PHENOBARBITAL: 35.6 ug/mL (ref 15.0–40.0)
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
## 2014-06-13 NOTE — ED Notes (Signed)
EDP at bedside  

## 2014-06-13 NOTE — ED Notes (Signed)
Upon undressing pt and getting pt into a gown. Urine noted to pants. Pt reports " I may of had a seizure." BLE trembling noted.

## 2014-06-13 NOTE — Discharge Instructions (Signed)
°Emergency Department Resource Guide °1) Find a Doctor and Pay Out of Pocket °Although you won't have to find out who is covered by your insurance plan, it is a good idea to ask around and get recommendations. You will then need to call the office and see if the doctor you have chosen will accept you as a new patient and what types of options they offer for patients who are self-pay. Some doctors offer discounts or will set up payment plans for their patients who do not have insurance, but you will need to ask so you aren't surprised when you get to your appointment. ° °2) Contact Your Local Health Department °Not all health departments have doctors that can see patients for sick visits, but many do, so it is worth a call to see if yours does. If you don't know where your local health department is, you can check in your phone book. The CDC also has a tool to help you locate your state's health department, and many state websites also have listings of all of their local health departments. ° °3) Find a Walk-in Clinic °If your illness is not likely to be very severe or complicated, you may want to try a walk in clinic. These are popping up all over the country in pharmacies, drugstores, and shopping centers. They're usually staffed by nurse practitioners or physician assistants that have been trained to treat common illnesses and complaints. They're usually fairly quick and inexpensive. However, if you have serious medical issues or chronic medical problems, these are probably not your best option. ° °No Primary Care Doctor: °- Call [facility] at  832-8000 - they can help you locate a primary care doctor that  accepts your insurance, provides certain services, etc. °- Physician Referral Service- 1-800-533-3463 ° °Chronic Pain Problems: °Organization         Address  Phone   Notes  °[facility]  (336) 297-2271 Patients need to be referred by their primary care doctor.  ° °Medication  Assistance: °Organization         Address  Phone   Notes  °[place] [facility] [address] °[place], [place] [address] (336) 641-8030 --Must be a resident of [place] °-- Must have NO insurance coverage whatsoever (no Medicaid/ Medicare, etc.) °-- The pt. MUST have a primary care doctor that directs their care regularly and follows them in the community °  °[facility]  (866) 331-1348   °[employer]  (888) 892-1162   ° °Agencies that provide inexpensive medical care: °Organization         Address  Phone   Notes  °[facility]  (336) 832-8035   °[facility]    (336) 832-7272   °[facility] [address] °[place],  [address] (336) 832-4777   °[facility] [address], °[place] (336) 271-4999   °[facility]    (336) 373-0678   °[facility]    (336) 272-1050   °[facility] ° [address], [place] Phone:  (336) 832-4444, Fax:  (336) 832-4440 Hours of Operation:  9 am - 6 pm, M-F.  Also accepts Medicaid/Medicare and self-pay.  °[facility] ° [address], [place] Phone: (336) 832-3150, Fax: (336) 832-3151. Hours of Operation:  8:30 am - 5:30 pm, M-F.  Also accepts Medicaid and self-pay.  °[facility] [place] [address], [place] Phone: (336) 878-6027   °[facility] [address], [place], [place] (336)723-1848, Ext. 123 Mondays & Thursdays: 7-9 AM.  First 15 patients are seen on a first come, first serve basis. °  ° °Medicaid-accepting [place] Providers: ° °Organization         Address  Phone   Notes  °[facility] [address], [place] (336) 641-2100 Also accepts self-pay patients.  °[facility] [address], [place] ° (336) 856-9996   °[facility] [address], [place]  (336) 288-8857   °[facility] [address] [place] [address], [place] (336) 299-7000   °Veita Bland [address], [place]  ° (336) 373-1557 Only accepts  Access Medicaid patients after they have their name applied to their card.  ° °Self-Pay (no insurance) in [place]: ° °Organization         Address  Phone   Notes  °Sickle Cell Patients, [facility] [address], [place] (336) 832-1970   °[facility] [address], [place] (336) 832-4400   °[facility] ° [address], [place] (336) 992-4800   °[facility]/Dr. Osei-Bonsu ° [address] [place] [address], [place] or [address], [place] (336) 841-8500 Phone number for both [place] and [place] locations is the same.  °[facility] [address], [place] (336) 299-0000   °[facility] [address] [place] [address], [place] or [address] (336) 852-7530 °(336) 878-2260   °[facility] [address], [place] (336) 350-1642, phone; (336) 294-5005, fax Sees patients 1st and 3rd Saturday of every month.  Must not qualify for public or private insurance (i.e. Medicaid, Medicare, [place] Health Choice, Veterans' Benefits) • Household income should be no more than 200% of the poverty level •The clinic cannot treat you if you are pregnant or think you are pregnant • Sexually transmitted diseases are not treated at the clinic.  ° ° °Dental Care: °Organization         Address  Phone  Notes  °[place] [facility] [address], [place] (336) 641-6152 Accepts children up to age 21 who are enrolled in Medicaid or [place] Health Choice; pregnant women with a Medicaid card; and children who have applied for Medicaid or [place] Health Choice, but were declined, whose parents can pay a reduced fee at time of service.  °[place]  [facility] [place]  [address], [place] (336) 641-7733 Accepts children up to age 21 who are enrolled in Medicaid or [place] Health Choice; pregnant women with a Medicaid card; and children who have applied for Medicaid or  Health Choice, but were declined, whose parents can pay a reduced fee at time of service.  °[facility] ° [address], [place] (336) 641-4533 Patients are seen by appointment only. Walk-ins are not accepted. [facility] will see patients 18 years of age and older. °Monday - Tuesday (8am-5pm) °Most Wednesdays (8:30-5pm) °$30 per visit, cash only  °[facility] ° [address], [place] (336) 641-4533 Patients are seen by appointment only. Walk-ins are not accepted. [facility] will see patients 18 years of age and older. °One   Wednesday Evening (Monthly: Volunteer Based).  $30 per visit, cash only  °[facility]  (919) 537-3737 for adults; Children under age 4, call Graduate Pediatric Dentistry at ([phone]) [phone]. Children aged 4-14, please call ([phone]) [phone] to request a pediatric application. ° Dental services are provided in all areas of dental care including fillings, crowns and bridges, complete and partial dentures, implants, gum treatment, root canals, and extractions. Preventive care is also provided. Treatment is provided to both adults and children. °Patients are selected via a lottery and there is often a waiting list. °  °[facility] [address], °[place] ° (336) 763-8833 [email or web address] °  °[facility] [address], [place], [place] (336)723-1848, Ext. 123 Second and Fourth Thursday of each month, opens at 6:30 AM; Clinic ends at 9 AM.  Patients are seen on a first-come first-served basis, and a limited number are seen during each clinic.  ° °[facility] ° [address], [place], [place] (336) 723-7904    Eligibility Requirements °You must have lived in [place], [place], or [place] counties for at least the last three months. °  You cannot be eligible for state or federal sponsored healthcare insurance, including Veterans Administration, Medicaid, or Medicare. °  You generally cannot be eligible for healthcare insurance through your employer.  °  How to apply: °Eligibility screenings are held every Tuesday and Wednesday afternoon from 1:00 pm until 4:00 pm. You do not need an appointment for the interview!  °[facility] [address], [place], [place] 336-631-2330   °[facility]  336-342-8273   °[facility]  336-703-3100   °[facility]  336-570-6415   ° °Behavioral Health Resources in the Community: °Intensive Outpatient Programs °Organization         Address  Phone  Notes  °[facility] [address], [place], [place] 336-878-6098   °[facility] [address], [place], [place] 336-832-9800   °[facility] [address], [place], [place] ° 336-882-2125   °[facility] [address],  °[place], [place] 1-800-853-5163 or 336-641-4981   °Substance Abuse Resources °Organization         Address  Phone  Notes  °[facility]  336-882-2125   °[facility]  336-784-9470   °[facility]  336-285-9073   °[facility]  336-845-3988   °Residential & Outpatient Substance Abuse Program  1-800-659-3381   °Psychological Services °Organization         Address  Phone  Notes  °[facility]  336- 832-9600   °[facility]  336- 378-7881   °[facility] [address], [place] 1-800-853-5163 or 336-641-4981   ° °Mobile Crisis Teams °Organization         Address  Phone  Notes  °[facility], Mobile Crisis Care Unit  1-877-626-1772   °Assertive °[facility] ° [address]  [place], [place] 336-834-9664   °Sharon DeEsch [address] °[place] 336-554-5454   ° °Self-Help/Support Groups °Organization         Address  Phone             Notes  °[facility]  - variety of support groups  336- 373-1402 Call for more information  °Narcotics Anonymous (NA), [facility] [address], °[place]  2 meetings at this location  ° °  Residential Treatment Programs Organization         Address  Phone  Notes  [facility] [address],    [place]  1-548-704-0708   [facility]  [address] [place] [address], [place], [place]   [place], [place] (509)829-7090 Admissions: 8am-3pm M-F  [facility] [address],    [place], [place] 017-494-4967   [facility] [address] [place], [place], [place]   [facility] [address],  [place], [place]   [facility] Intensive Outpatient [address], [place], [place]   [facility] ([place] [address]  [place], [place] 1-762-353-2610 or 9856469848   [facility] [address], [place], [place] Accepts Medicaid  [facility] [address],  [place] 1-6205648517 Substance Abuse/Addiction Treatment   [facility] Organization         Address  Phone  Notes  [facility]  763-361-2814   Domenic Schwab, PhD [address] [place], [place]   2106649397 or 516-582-1286   [facility] [place], [place] (615)618-1658   [facility] [address] [place], [place] [phone] Insurance/Medicaid/sponsorship through [facility] [address]                                    [place], [place] (306)[phone] [place] [address], [place] [phone]    Dr. Adele Schilder  220-365-7624   [facility] Dept. 1) [address] [place] 2) [place] 3)  [address], [place] (727)[phone] [phone]) [phone]  ([phone]   [place] 940-635-6484 or [phone] (After Hours)      Take your usual prescriptions as previously directed. Take your blood sugars at home twice a day and keep a diary of the readings. Show this to your family doctor at your next office visit. Call your regular medical doctor tomorrow to schedule a follow up appointment within the next 2 days.  Return to the Emergency Department immediately sooner if worsening.

## 2014-06-13 NOTE — ED Notes (Addendum)
Pt brought in EMS, per EMS, pt slipped out of wheelchair and hit head on the way down. CBG 278 en route. Pt reports headache and "not feeling right.". Pt denies loc.

## 2014-09-05 ENCOUNTER — Other Ambulatory Visit: Payer: Self-pay

## 2014-09-07 MED ORDER — PANCRELIPASE (LIP-PROT-AMYL) 36000-114000 UNITS PO CPEP: 108000.0000 [IU] | ORAL_CAPSULE | Freq: Three times a day (TID) | ORAL | Status: DC

## 2014-11-09 ENCOUNTER — Inpatient Hospital Stay (HOSPITAL_COMMUNITY)
Admission: EM | Admit: 2014-11-09 | Discharge: 2014-11-12 | DRG: 439 | Disposition: A | Discharge status: Home / Self Care | Payer: Medicare Other | Attending: Internal Medicine | Admitting: Internal Medicine

## 2014-11-09 ENCOUNTER — Encounter (HOSPITAL_COMMUNITY): Payer: Self-pay | Admitting: Emergency Medicine

## 2014-11-09 ENCOUNTER — Emergency Department (HOSPITAL_COMMUNITY): Payer: Medicare Other

## 2014-11-09 DIAGNOSIS — K859 Acute pancreatitis without necrosis or infection, unspecified: Secondary | ICD-10-CM | POA: Diagnosis present

## 2014-11-09 DIAGNOSIS — F17210 Nicotine dependence, cigarettes, uncomplicated: Secondary | ICD-10-CM | POA: Diagnosis present

## 2014-11-09 DIAGNOSIS — E780 Pure hypercholesterolemia: Secondary | ICD-10-CM | POA: Diagnosis present

## 2014-11-09 DIAGNOSIS — G8191 Hemiplegia, unspecified affecting right dominant side: Secondary | ICD-10-CM | POA: Diagnosis present

## 2014-11-09 DIAGNOSIS — G40909 Epilepsy, unspecified, not intractable, without status epilepticus: Secondary | ICD-10-CM

## 2014-11-09 DIAGNOSIS — E119 Type 2 diabetes mellitus without complications: Secondary | ICD-10-CM | POA: Diagnosis present

## 2014-11-09 DIAGNOSIS — I10 Essential (primary) hypertension: Secondary | ICD-10-CM | POA: Diagnosis present

## 2014-11-09 DIAGNOSIS — E785 Hyperlipidemia, unspecified: Secondary | ICD-10-CM | POA: Diagnosis present

## 2014-11-09 MED ORDER — ONDANSETRON HCL 4 MG/2ML IJ SOLN
4.0000 mg | Freq: Once | INTRAMUSCULAR | Status: AC
  Administered 2014-11-10: 4 mg via INTRAVENOUS
  Filled 2014-11-09: qty 2

## 2014-11-09 MED ORDER — SODIUM CHLORIDE 0.9 % IV BOLUS (SEPSIS)
1000.0000 mL | Freq: Once | INTRAVENOUS | Status: AC
  Administered 2014-11-10: 1000 mL via INTRAVENOUS

## 2014-11-09 MED ORDER — FENTANYL CITRATE (PF) 100 MCG/2ML IJ SOLN
50.0000 ug | Freq: Once | INTRAMUSCULAR | Status: AC
  Administered 2014-11-10: 50 ug via INTRAVENOUS
  Filled 2014-11-09: qty 2
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
## 2014-11-09 NOTE — ED Notes (Signed)
Patient complaining of abdominal pain to middle of abdomen. Reports started yesterday. Denies nausea, vomiting, diarrhea.

## 2014-11-10 ENCOUNTER — Inpatient Hospital Stay (HOSPITAL_COMMUNITY): Payer: Medicare Other

## 2014-11-10 ENCOUNTER — Encounter (HOSPITAL_COMMUNITY): Payer: Self-pay | Admitting: General Practice

## 2014-11-10 DIAGNOSIS — G40909 Epilepsy, unspecified, not intractable, without status epilepticus: Secondary | ICD-10-CM | POA: Diagnosis present

## 2014-11-10 DIAGNOSIS — K858 Other acute pancreatitis: Secondary | ICD-10-CM

## 2014-11-10 DIAGNOSIS — E780 Pure hypercholesterolemia: Secondary | ICD-10-CM | POA: Diagnosis present

## 2014-11-10 DIAGNOSIS — G8191 Hemiplegia, unspecified affecting right dominant side: Secondary | ICD-10-CM | POA: Diagnosis present

## 2014-11-10 DIAGNOSIS — F17210 Nicotine dependence, cigarettes, uncomplicated: Secondary | ICD-10-CM | POA: Diagnosis present

## 2014-11-10 DIAGNOSIS — K859 Acute pancreatitis, unspecified: Secondary | ICD-10-CM | POA: Diagnosis present

## 2014-11-10 DIAGNOSIS — I10 Essential (primary) hypertension: Secondary | ICD-10-CM | POA: Diagnosis present

## 2014-11-10 DIAGNOSIS — E785 Hyperlipidemia, unspecified: Secondary | ICD-10-CM | POA: Diagnosis present

## 2014-11-10 DIAGNOSIS — E119 Type 2 diabetes mellitus without complications: Secondary | ICD-10-CM | POA: Diagnosis present

## 2014-11-10 LAB — COMPREHENSIVE METABOLIC PANEL
ALBUMIN: 3.7 g/dL (ref 3.5–5.0)
ALK PHOS: 88 U/L (ref 38–126)
ALT: 14 U/L — ABNORMAL LOW (ref 17–63)
ALT: 11 U/L — ABNORMAL LOW (ref 17–63)
ANION GAP: 8 (ref 5–15)
AST: 15 U/L (ref 15–41)
AST: 14 U/L — ABNORMAL LOW (ref 15–41)
Albumin: 3.5 g/dL (ref 3.5–5.0)
Alkaline Phosphatase: 89 U/L (ref 38–126)
Anion gap: 6 (ref 5–15)
BILIRUBIN TOTAL: 0.6 mg/dL (ref 0.3–1.2)
BILIRUBIN TOTAL: 0.5 mg/dL (ref 0.3–1.2)
BUN: 8 mg/dL (ref 6–20)
BUN: 8 mg/dL (ref 6–20)
CALCIUM: 8.4 mg/dL — AB (ref 8.9–10.3)
CHLORIDE: 101 mmol/L (ref 101–111)
CO2: 26 mmol/L (ref 22–32)
CO2: 25 mmol/L (ref 22–32)
Calcium: 8.3 mg/dL — ABNORMAL LOW (ref 8.9–10.3)
Chloride: 105 mmol/L (ref 101–111)
Creatinine, Ser: 0.88 mg/dL (ref 0.61–1.24)
Creatinine, Ser: 0.78 mg/dL (ref 0.61–1.24)
GFR calc Af Amer: >60 mL/min (ref >60)
GFR calc non Af Amer: >60 mL/min (ref >60)
GFR calc non Af Amer: >60 mL/min (ref >60)
GLUCOSE: 142 mg/dL — AB (ref 65–99)
Glucose, Bld: 138 mg/dL — ABNORMAL HIGH (ref 65–99)
POTASSIUM: 3.6 mmol/L (ref 3.5–5.1)
Potassium: 3.8 mmol/L (ref 3.5–5.1)
Sodium: 135 mmol/L (ref 135–145)
Sodium: 136 mmol/L (ref 135–145)
Total Protein: 7.1 g/dL (ref 6.5–8.1)
Total Protein: 6.8 g/dL (ref 6.5–8.1)

## 2014-11-10 LAB — URINALYSIS, ROUTINE W REFLEX MICROSCOPIC
Bilirubin Urine: NEGATIVE
Glucose, UA: NEGATIVE mg/dL
HGB URINE DIPSTICK: NEGATIVE
Ketones, ur: NEGATIVE mg/dL
Leukocytes, UA: NEGATIVE
NITRITE: NEGATIVE
Protein, ur: NEGATIVE mg/dL
SPECIFIC GRAVITY, URINE: 1.01 (ref 1.005–1.030)
Urobilinogen, UA: 0.2 mg/dL (ref 0.0–1.0)
pH: 6.5 (ref 5.0–8.0)

## 2014-11-10 LAB — RAPID URINE DRUG SCREEN, HOSP PERFORMED
AMPHETAMINES: NOT DETECTED
Barbiturates: POSITIVE — AB
Benzodiazepines: NOT DETECTED
Cocaine: NOT DETECTED
Opiates: NOT DETECTED
TETRAHYDROCANNABINOL: POSITIVE — AB

## 2014-11-10 LAB — CBC
HCT: 48.3 % (ref 39.0–52.0)
Hemoglobin: 16.7 g/dL (ref 13.0–17.0)
MCH: 31 pg (ref 26.0–34.0)
MCHC: 34.6 g/dL (ref 30.0–36.0)
MCV: 89.8 fL (ref 78.0–100.0)
Platelets: 150 10*3/uL (ref 150–400)
RBC: 5.38 MIL/uL (ref 4.22–5.81)
RDW: 13.5 % (ref 11.5–15.5)
WBC: 11.6 10*3/uL — ABNORMAL HIGH (ref 4.0–10.5)

## 2014-11-10 LAB — CBC WITH DIFFERENTIAL/PLATELET
BASOS PCT: 0 % (ref 0–1)
Basophils Absolute: 0 10*3/uL (ref 0.0–0.1)
Eosinophils Absolute: 0.1 10*3/uL (ref 0.0–0.7)
Eosinophils Relative: 1 % (ref 0–5)
HEMATOCRIT: 50.3 % (ref 39.0–52.0)
HEMOGLOBIN: 17.7 g/dL — AB (ref 13.0–17.0)
LYMPHS ABS: 2 10*3/uL (ref 0.7–4.0)
LYMPHS PCT: 17 % (ref 12–46)
MCH: 31.8 pg (ref 26.0–34.0)
MCHC: 35.2 g/dL (ref 30.0–36.0)
MCV: 90.5 fL (ref 78.0–100.0)
MONO ABS: 1 10*3/uL (ref 0.1–1.0)
MONOS PCT: 9 % (ref 3–12)
NEUTROS PCT: 73 % (ref 43–77)
Neutro Abs: 8.5 10*3/uL — ABNORMAL HIGH (ref 1.7–7.7)
Platelets: 143 10*3/uL — ABNORMAL LOW (ref 150–400)
RBC: 5.56 MIL/uL (ref 4.22–5.81)
RDW: 13.7 % (ref 11.5–15.5)
WBC: 11.6 10*3/uL — ABNORMAL HIGH (ref 4.0–10.5)

## 2014-11-10 LAB — GLUCOSE, CAPILLARY
GLUCOSE-CAPILLARY: 130 mg/dL — AB (ref 65–99)
GLUCOSE-CAPILLARY: 125 mg/dL — AB (ref 65–99)
Glucose-Capillary: 131 mg/dL — ABNORMAL HIGH (ref 65–99)

## 2014-11-10 LAB — LIPASE, BLOOD
LIPASE: 95 U/L — AB (ref 22–51)
Lipase: 151 U/L — ABNORMAL HIGH (ref 22–51)

## 2014-11-10 LAB — ETHANOL

## 2014-11-10 MED ORDER — ONDANSETRON HCL 4 MG PO TABS: 4.0000 mg | ORAL_TABLET | Freq: Four times a day (QID) | ORAL | Status: DC | PRN

## 2014-11-10 MED ORDER — ACETAMINOPHEN 325 MG PO TABS: 650.0000 mg | ORAL_TABLET | Freq: Four times a day (QID) | ORAL | Status: DC | PRN

## 2014-11-10 MED ORDER — PRAVASTATIN SODIUM 10 MG PO TABS
20.0000 mg | ORAL_TABLET | Freq: Every day | ORAL | Status: DC
Start: 2014-11-10 — End: 2014-11-12
  Administered 2014-11-10 – 2014-11-12 (×3): 20 mg via ORAL
  Filled 2014-11-10 (×3): qty 2

## 2014-11-10 MED ORDER — NAPROXEN 500 MG PO TABS: ORAL_TABLET | ORAL | Status: DC

## 2014-11-10 MED ORDER — INSULIN ASPART 100 UNIT/ML ~~LOC~~ SOLN
0.0000 [IU] | Freq: Three times a day (TID) | SUBCUTANEOUS | Status: DC
  Administered 2014-11-10 – 2014-11-12 (×4): 1 [IU] via SUBCUTANEOUS

## 2014-11-10 MED ORDER — CYCLOBENZAPRINE HCL 10 MG PO TABS: 10.0000 mg | ORAL_TABLET | Freq: Three times a day (TID) | ORAL | Status: DC | PRN

## 2014-11-10 MED ORDER — FENTANYL CITRATE (PF) 100 MCG/2ML IJ SOLN: 25.0000 ug | INTRAMUSCULAR | Status: DC | PRN

## 2014-11-10 MED ORDER — IOHEXOL 300 MG/ML  SOLN
100.0000 mL | Freq: Once | INTRAMUSCULAR | Status: AC | PRN
  Administered 2014-11-10: 100 mL via INTRAVENOUS

## 2014-11-10 MED ORDER — HYDRALAZINE HCL 25 MG PO TABS: 25.0000 mg | ORAL_TABLET | Freq: Four times a day (QID) | ORAL | Status: DC | PRN

## 2014-11-10 MED ORDER — METOPROLOL SUCCINATE ER 50 MG PO TB24
50.0000 mg | ORAL_TABLET | Freq: Every morning | ORAL | Status: DC
  Administered 2014-11-10 – 2014-11-12 (×3): 50 mg via ORAL
  Filled 2014-11-10 (×3): qty 1

## 2014-11-10 MED ORDER — SODIUM CHLORIDE 0.9 % IV SOLN
INTRAVENOUS | Status: DC
  Administered 2014-11-10 – 2014-11-12 (×6): via INTRAVENOUS

## 2014-11-10 MED ORDER — IOHEXOL 300 MG/ML  SOLN
25.0000 mL | Freq: Once | INTRAMUSCULAR | Status: AC | PRN
Start: 2014-11-10 — End: 2014-11-10
  Administered 2014-11-10: 25 mL via ORAL

## 2014-11-10 MED ORDER — LOSARTAN POTASSIUM 50 MG PO TABS
100.0000 mg | ORAL_TABLET | Freq: Every morning | ORAL | Status: DC
  Administered 2014-11-10 – 2014-11-12 (×3): 100 mg via ORAL
  Filled 2014-11-10 (×3): qty 2

## 2014-11-10 MED ORDER — PHENOBARBITAL 97.2 MG PO TABS
97.2000 mg | ORAL_TABLET | Freq: Two times a day (BID) | ORAL | Status: DC
  Administered 2014-11-10 – 2014-11-12 (×5): 97.2 mg via ORAL
  Filled 2014-11-10 (×5): qty 1

## 2014-11-10 MED ORDER — FENTANYL CITRATE (PF) 100 MCG/2ML IJ SOLN: 50.0000 ug | INTRAMUSCULAR | Status: DC | PRN

## 2014-11-10 MED ORDER — SODIUM CHLORIDE 0.9 % IV SOLN: INTRAVENOUS | Status: DC

## 2014-11-10 MED ORDER — ONDANSETRON HCL 4 MG/2ML IJ SOLN: 4.0000 mg | Freq: Four times a day (QID) | INTRAMUSCULAR | Status: DC | PRN

## 2014-11-10 MED ORDER — LEVETIRACETAM 500 MG PO TABS
1000.0000 mg | ORAL_TABLET | Freq: Two times a day (BID) | ORAL | Status: DC
  Administered 2014-11-10 – 2014-11-12 (×5): 1000 mg via ORAL
  Filled 2014-11-10 (×5): qty 2

## 2014-11-10 MED ORDER — ACETAMINOPHEN 650 MG RE SUPP: 650.0000 mg | Freq: Four times a day (QID) | RECTAL | Status: DC | PRN

## 2014-11-10 MED ORDER — FENTANYL CITRATE (PF) 100 MCG/2ML IJ SOLN
50.0000 ug | Freq: Once | INTRAMUSCULAR | Status: DC
  Filled 2014-11-10: qty 2

## 2014-11-10 MED ORDER — ENOXAPARIN SODIUM 40 MG/0.4ML ~~LOC~~ SOLN
40.0000 mg | SUBCUTANEOUS | Status: DC
  Administered 2014-11-10 – 2014-11-12 (×3): 40 mg via SUBCUTANEOUS
  Filled 2014-11-10 (×3): qty 0.4

## 2014-11-10 MED ORDER — ONDANSETRON HCL 4 MG/2ML IJ SOLN: 4.0000 mg | Freq: Three times a day (TID) | INTRAMUSCULAR | Status: DC | PRN

## 2014-11-10 NOTE — Progress Notes (Signed)
Pt admitted from ED.  Pt states he is sleepy, with no other current complaints, no acute distress. Pt oriented to room environment call bell give.  Pt safety discussed with emphasis placed on fall prevention, patient with verbalized understandings.  PIV in R hand, patent with NS infusing per order.  Pt personal belongings at bedside including wallet and silver necklace, personal belongins policy discussed.  Pt bathed and admission assessment completed.  Will continue to monitor closely.

## 2014-11-10 NOTE — Progress Notes (Signed)
Patient seen and examined. Admitted earlier today with complaints of epigastric abdominal pain. Found to have an elevated lipase as well as confirmed pancreatitis on CT scan of the abdomen. Still complains of significant pain this morning. Continue nothing by mouth status, IV fluids, pain and anti-emetics as deemed necessary. We'll continue to follow. Will obtain right upper quadrant ultrasound looking for gallstones. Patient denies history of alcohol abuse.  Domingo Mend, MD [facility] Pager: [phone]

## 2014-11-10 NOTE — ED Notes (Signed)
Fentanyl 50 mcg wasted and witness by Cheri Rous, RN. Fentanyl was left in this nurse's pocket and patient was no longer showing up in pyxis to waste in pyxis.

## 2014-11-10 NOTE — Progress Notes (Signed)
Utilization review Completed Marshawn Normoyle RN BSN   
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
## 2014-11-11 DIAGNOSIS — G40909 Epilepsy, unspecified, not intractable, without status epilepticus: Secondary | ICD-10-CM

## 2014-11-11 LAB — BASIC METABOLIC PANEL
Anion gap: 10 (ref 5–15)
BUN: 8 mg/dL (ref 6–20)
CHLORIDE: 104 mmol/L (ref 101–111)
CO2: 21 mmol/L — AB (ref 22–32)
Calcium: 8.1 mg/dL — ABNORMAL LOW (ref 8.9–10.3)
Creatinine, Ser: 0.79 mg/dL (ref 0.61–1.24)
GFR calc non Af Amer: >60 mL/min (ref >60)
Glucose, Bld: 117 mg/dL — ABNORMAL HIGH (ref 65–99)
POTASSIUM: 3.5 mmol/L (ref 3.5–5.1)
Sodium: 135 mmol/L (ref 135–145)

## 2014-11-11 LAB — GLUCOSE, CAPILLARY
GLUCOSE-CAPILLARY: 117 mg/dL — AB (ref 65–99)
GLUCOSE-CAPILLARY: 120 mg/dL — AB (ref 65–99)
Glucose-Capillary: 142 mg/dL — ABNORMAL HIGH (ref 65–99)
Glucose-Capillary: 105 mg/dL — ABNORMAL HIGH (ref 65–99)

## 2014-11-11 LAB — CBC
HCT: 47.3 % (ref 39.0–52.0)
Hemoglobin: 16.2 g/dL (ref 13.0–17.0)
MCH: 30.7 pg (ref 26.0–34.0)
MCHC: 34.2 g/dL (ref 30.0–36.0)
MCV: 89.6 fL (ref 78.0–100.0)
PLATELETS: 133 10*3/uL — AB (ref 150–400)
RBC: 5.28 MIL/uL (ref 4.22–5.81)
RDW: 13.4 % (ref 11.5–15.5)
WBC: 10 10*3/uL (ref 4.0–10.5)

## 2014-11-11 MED ORDER — OXYCODONE HCL 5 MG PO TABS: 5.0000 mg | ORAL_TABLET | ORAL | Status: DC | PRN

## 2014-11-11 MED ORDER — METFORMIN HCL 500 MG PO TABS
1000.0000 mg | ORAL_TABLET | Freq: Two times a day (BID) | ORAL | Status: DC
  Administered 2014-11-11 – 2014-11-12 (×2): 1000 mg via ORAL
  Filled 2014-11-11 (×2): qty 2

## 2014-11-11 NOTE — Progress Notes (Signed)
[facility] PROGRESS NOTE  [name] YTK:[id number] DOB: 1960/10/12 DOA: 11/09/2014 PCP: [name], MD  Assessment/Plan: Acute Pancreatitis -Clinically improved. -States has had no pain and would like to eat. -Will start clears and advance as tolerated quickly. -Etiology remains unclear: denies ETOH use and RUQ [place] negative for gallstones or CBD dilatation. ?effect of HCTZ; will d/c indefinitely at this point. -Will DC IV pain meds since advancing diet.  HTN -Fair control. -Resume home meds.  Seizure Disorder -Continue keppa, phenobarbital  Code Status: Full Code Family Communication: Patient only  Disposition Plan: Home when ready, likely 24 hours   Consultants:  None   Antibiotics:  None   Subjective: No further abdominal pain. Wants to eat.  Objective: Filed Vitals:   11/10/14 0430 11/10/14 0545 11/10/14 2203 11/11/14 0657  BP: 161/86 166/80 159/95 155/89  Pulse: 81 88 85 85  Temp:  98.8 F (37.1 C) 97.7 F (36.5 C) 98.7 F (37.1 C)  TempSrc:  Oral Oral Oral  Resp:   20 20  Height:  '6\' 2"'$  (1.88 m)    Weight:  93 kg (205 lb 0.4 oz)    SpO2: 93% 95% 100% 96%    Intake/Output Summary (Last 24 hours) at 11/11/14 1054 Last data filed at 11/11/14 1020  Gross per 24 hour  Intake      0 ml  Output   1750 ml  Net  -1750 ml   Filed Weights   11/09/14 2307 11/10/14 0545  Weight: 90.719 kg (200 lb) 93 kg (205 lb 0.4 oz)    Exam:   General:  AA Ox3  Cardiovascular: RRR  Respiratory: CTA B  Abdomen: S/NT/ND/+BS  Extremities: no C/C/E   Neurologic:  Intact/non-focal  Data Reviewed: Basic Metabolic Panel:  Recent Labs Lab 11/10/14 0120 11/10/14 0634 11/11/14 0618  NA 135 136 135  K 3.8 3.6 3.5  CL 101 105 104  CO2 26 25 21*  GLUCOSE 138* 142* 117*  BUN '8 8 8  '$ CREATININE 0.88 0.78 0.79  CALCIUM 8.3* 8.4* 8.1*   Liver Function Tests:  Recent Labs Lab 11/10/14 0120 11/10/14 0634  AST 15 14*  ALT 14* 11*  ALKPHOS  88 89  BILITOT 0.6 0.5  PROT 7.1 6.8  ALBUMIN 3.7 3.5    Recent Labs Lab 11/10/14 0120 11/10/14 0634  LIPASE 151* 95*   No results for input(s): AMMONIA in the last 168 hours. CBC:  Recent Labs Lab 11/10/14 11/10/14 0634 11/11/14 0618  WBC 11.6* 11.6* 10.0  NEUTROABS 8.5*  --   --   HGB 17.7* 16.7 16.2  HCT 50.3 48.3 47.3  MCV 90.5 89.8 89.6  PLT 143* 150 133*   Cardiac Enzymes: No results for input(s): CKTOTAL, CKMB, CKMBINDEX, TROPONINI in the last 168 hours. BNP (last 3 results) No results for input(s): BNP in the last 8760 hours.  ProBNP (last 3 results) No results for input(s): PROBNP in the last 8760 hours.  CBG:  Recent Labs Lab 11/10/14 1232 11/10/14 1724 11/10/14 2145 11/11/14 0732  GLUCAP 131* 130* 125* 117*    No results found for this or any previous visit (from the past 240 hour(s)).   Studies: Ct Abdomen Pelvis W Contrast  11/10/2014   CLINICAL DATA:  Right lower quadrant pain since yesterday.  EXAM: CT ABDOMEN AND PELVIS WITH CONTRAST  TECHNIQUE: Multidetector CT imaging of the abdomen and pelvis was performed using the standard protocol following bolus administration of intravenous contrast.  CONTRAST:  64m  OMNIPAQUE IOHEXOL 300 MG/ML SOLN, 129m OMNIPAQUE IOHEXOL 300 MG/ML SOLN  COMPARISON:  CT 05/17/2013  FINDINGS: Lower chest: Linear atelectasis in the right lower lobe. Atherosclerotic coronary artery calcifications.  Liver: No focal lesion.  Prominent size.  Hepatobiliary: Gallbladder physiologically distended. No biliary dilatation.  Pancreas: Mild soft tissue stranding about pancreatic tail. The previously described 9 mm low-attenuation focus in the uncinate process of the pancreas is not as well defined on current exam. There is no pancreatic pseudocyst. No ductal dilatation.  Spleen: Normal in size and density.  Adrenal glands: No nodule.  Kidneys: Tiny cortical hypodensities in the left kidney, too small to characterize. No hydronephrosis. No  perinephric stranding.  Stomach/Bowel: Stomach is decompressed. A few prominent fluid-filled small bowel loops in the right lower abdomen. No bowel obstruction. Moderate volume of stool throughout the colon without colonic wall thickening. Mild colonic diverticulosis without diverticulitis. The appendix is normal.  Vascular/Lymphatic: Multiple small retroperitoneal lymph nodes. Normal caliber abdominal aorta with moderate atherosclerosis. No aneurysm. Prominent bilateral inguinal lymph nodes. These are similar to prior.  Reproductive: Prostate gland is normal in size.  Bladder: Distended without wall thickening.  Other: No free air or intra-abdominal fluid collection. No significant ascites. Small fat containing right inguinal hernia.  Musculoskeletal: There are no acute or suspicious osseous abnormalities. Avascular necrosis of the left femoral head.  IMPRESSION: 1. Findings consistent with acute pancreatitis. 2. The previously described subcentimeter low-density focus in the uncinate process of the pancreas is not as well seen on the current exam. 3. Small fat containing right inguinal hernia without bowel involvement. Mild diverticulosis without diverticulitis. Atherosclerosis.   Electronically Signed   By: MJeb LeveringM.D.   On: 11/10/2014 01:27   [place]dom[place] Limited Ruq  11/10/2014   CLINICAL DATA:  54year old male with a history of pancreatitis and upper abdominal pain  EXAM: [place]ABDOMEN LIMITED - RIGHT UPPER QUADRANT  COMPARISON:  11/10/2014  FINDINGS: Gallbladder:  No significant gallbladder wall thickening. Anechoic fluid with no gallstones identified. Negative sonographic Murphy's sign.  Common bile duct:  Diameter: 4.0  Liver:  Uniform echogenicity of the liver. Cranial caudal span of the right liver measures nearly 20 cm.  IMPRESSION: No evidence of cholelithiasis.  Hepatomegaly.  Signed,  JDulcy Fanny WEarleen Newport DO  [facility]  [facility]   Electronically  Signed   By: JCorrie MckusickD.O.   On: 11/10/2014 12:08    Scheduled Meds: . enoxaparin (LOVENOX) injection  40 mg Subcutaneous Q24H  . fentaNYL (SUBLIMAZE) injection  50 mcg Intravenous Once  . insulin aspart  0-9 Units Subcutaneous TID WC  . levETIRAcetam  1,000 mg Oral BID  . losartan  100 mg Oral q morning - 10a  . metoprolol succinate  50 mg Oral q morning - 10a  . PHENobarbital  97.2 mg Oral BID  . pravastatin  20 mg Oral Daily   Continuous Infusions: . sodium chloride 125 mL/hr at 11/10/14 2136    Active Problems:   PANCREATITIS    Time spent: 25 minutes. Greater than 50% of this time was spent in direct contact with the patient coordinating care.    HLelon Frohlich [facility] Pager [phone] If 7PM-7AM, please contact night-coverage at [email or web address], password [email or web address]7/31/2016, 10:54 AM  LOS: 1 day

## 2014-11-11 NOTE — Progress Notes (Signed)
REVIEWED-NO ADDITIONAL RECOMMENDATIONS. 

## 2014-11-12 DIAGNOSIS — I10 Essential (primary) hypertension: Secondary | ICD-10-CM

## 2014-11-12 DIAGNOSIS — G40909 Epilepsy, unspecified, not intractable, without status epilepticus: Secondary | ICD-10-CM

## 2014-11-12 LAB — BASIC METABOLIC PANEL
ANION GAP: 9 (ref 5–15)
BUN: 5 mg/dL — AB (ref 6–20)
CO2: 21 mmol/L — ABNORMAL LOW (ref 22–32)
Calcium: 8.1 mg/dL — ABNORMAL LOW (ref 8.9–10.3)
Chloride: 106 mmol/L (ref 101–111)
Creatinine, Ser: 0.7 mg/dL (ref 0.61–1.24)
GFR calc Af Amer: >60 mL/min (ref >60)
Glucose, Bld: 132 mg/dL — ABNORMAL HIGH (ref 65–99)
Potassium: 3.5 mmol/L (ref 3.5–5.1)
SODIUM: 136 mmol/L (ref 135–145)

## 2014-11-12 LAB — GLUCOSE, CAPILLARY
GLUCOSE-CAPILLARY: 133 mg/dL — AB (ref 65–99)
Glucose-Capillary: 120 mg/dL — ABNORMAL HIGH (ref 65–99)

## 2014-11-12 LAB — HEMOGLOBIN A1C
Hgb A1c MFr Bld: 6.3 % — ABNORMAL HIGH (ref 4.8–5.6)
MEAN PLASMA GLUCOSE: 134 mg/dL

## 2014-11-12 LAB — CBC
HCT: 47.1 % (ref 39.0–52.0)
HEMOGLOBIN: 16.1 g/dL (ref 13.0–17.0)
MCH: 30.3 pg (ref 26.0–34.0)
MCHC: 34.2 g/dL (ref 30.0–36.0)
MCV: 88.5 fL (ref 78.0–100.0)
PLATELETS: 145 10*3/uL — AB (ref 150–400)
RBC: 5.32 MIL/uL (ref 4.22–5.81)
RDW: 13.2 % (ref 11.5–15.5)
WBC: 8.3 10*3/uL (ref 4.0–10.5)

## 2014-11-12 MED ORDER — OXYCODONE HCL 5 MG PO TABS: 5.0000 mg | ORAL_TABLET | ORAL | Status: DC | PRN
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
## 2014-11-16 ENCOUNTER — Inpatient Hospital Stay (HOSPITAL_COMMUNITY)
Admission: EM | Admit: 2014-11-16 | Discharge: 2014-11-20 | DRG: 439 | Disposition: A | Discharge status: Home / Self Care | Payer: Medicare Other | Attending: Internal Medicine | Admitting: Internal Medicine

## 2014-11-16 ENCOUNTER — Emergency Department (HOSPITAL_COMMUNITY): Payer: Medicare Other

## 2014-11-16 ENCOUNTER — Encounter (HOSPITAL_COMMUNITY): Payer: Self-pay | Admitting: *Deleted

## 2014-11-16 DIAGNOSIS — F17210 Nicotine dependence, cigarettes, uncomplicated: Secondary | ICD-10-CM | POA: Diagnosis present

## 2014-11-16 DIAGNOSIS — K861 Other chronic pancreatitis: Secondary | ICD-10-CM | POA: Diagnosis present

## 2014-11-16 DIAGNOSIS — K859 Acute pancreatitis without necrosis or infection, unspecified: Secondary | ICD-10-CM | POA: Diagnosis present

## 2014-11-16 DIAGNOSIS — G40909 Epilepsy, unspecified, not intractable, without status epilepticus: Secondary | ICD-10-CM | POA: Diagnosis present

## 2014-11-16 DIAGNOSIS — Z720 Tobacco use: Secondary | ICD-10-CM | POA: Diagnosis not present

## 2014-11-16 DIAGNOSIS — G8191 Hemiplegia, unspecified affecting right dominant side: Secondary | ICD-10-CM | POA: Diagnosis present

## 2014-11-16 DIAGNOSIS — Z91040 Latex allergy status: Secondary | ICD-10-CM

## 2014-11-16 DIAGNOSIS — Z881 Allergy status to other antibiotic agents status: Secondary | ICD-10-CM

## 2014-11-16 DIAGNOSIS — K858 Other acute pancreatitis: Secondary | ICD-10-CM | POA: Diagnosis not present

## 2014-11-16 DIAGNOSIS — E119 Type 2 diabetes mellitus without complications: Secondary | ICD-10-CM | POA: Diagnosis present

## 2014-11-16 DIAGNOSIS — E780 Pure hypercholesterolemia: Secondary | ICD-10-CM | POA: Diagnosis present

## 2014-11-16 DIAGNOSIS — Z79899 Other long term (current) drug therapy: Secondary | ICD-10-CM | POA: Diagnosis not present

## 2014-11-16 DIAGNOSIS — I10 Essential (primary) hypertension: Secondary | ICD-10-CM | POA: Diagnosis present

## 2014-11-16 DIAGNOSIS — E785 Hyperlipidemia, unspecified: Secondary | ICD-10-CM | POA: Diagnosis present

## 2014-11-16 DIAGNOSIS — K850 Idiopathic acute pancreatitis: Secondary | ICD-10-CM | POA: Diagnosis not present

## 2014-11-16 DIAGNOSIS — F1290 Cannabis use, unspecified, uncomplicated: Secondary | ICD-10-CM | POA: Diagnosis present

## 2014-11-16 DIAGNOSIS — E118 Type 2 diabetes mellitus with unspecified complications: Secondary | ICD-10-CM | POA: Diagnosis not present

## 2014-11-16 LAB — CBC WITH DIFFERENTIAL/PLATELET
BASOS PCT: 0 % (ref 0–1)
Basophils Absolute: 0 10*3/uL (ref 0.0–0.1)
EOS PCT: 2 % (ref 0–5)
Eosinophils Absolute: 0.2 10*3/uL (ref 0.0–0.7)
HEMATOCRIT: 46.8 % (ref 39.0–52.0)
Hemoglobin: 16 g/dL (ref 13.0–17.0)
LYMPHS ABS: 2.2 10*3/uL (ref 0.7–4.0)
LYMPHS PCT: 20 % (ref 12–46)
MCH: 30.9 pg (ref 26.0–34.0)
MCHC: 34.2 g/dL (ref 30.0–36.0)
MCV: 90.3 fL (ref 78.0–100.0)
Monocytes Absolute: 0.8 10*3/uL (ref 0.1–1.0)
Monocytes Relative: 7 % (ref 3–12)
Neutro Abs: 7.5 10*3/uL (ref 1.7–7.7)
Neutrophils Relative %: 71 % (ref 43–77)
PLATELETS: 184 10*3/uL (ref 150–400)
RBC: 5.18 MIL/uL (ref 4.22–5.81)
RDW: 13.7 % (ref 11.5–15.5)
WBC: 10.7 10*3/uL — AB (ref 4.0–10.5)

## 2014-11-16 LAB — COMPREHENSIVE METABOLIC PANEL
ALBUMIN: 3.9 g/dL (ref 3.5–5.0)
ALK PHOS: 100 U/L (ref 38–126)
ALT: 18 U/L (ref 17–63)
AST: 13 U/L — AB (ref 15–41)
Anion gap: 9 (ref 5–15)
BUN: 13 mg/dL (ref 6–20)
CHLORIDE: 104 mmol/L (ref 101–111)
CO2: 26 mmol/L (ref 22–32)
Calcium: 8.7 mg/dL — ABNORMAL LOW (ref 8.9–10.3)
Creatinine, Ser: 0.96 mg/dL (ref 0.61–1.24)
GFR calc Af Amer: >60 mL/min (ref >60)
Glucose, Bld: 163 mg/dL — ABNORMAL HIGH (ref 65–99)
Potassium: 3.7 mmol/L (ref 3.5–5.1)
Sodium: 139 mmol/L (ref 135–145)
TOTAL PROTEIN: 7.4 g/dL (ref 6.5–8.1)
Total Bilirubin: 0.3 mg/dL (ref 0.3–1.2)

## 2014-11-16 LAB — LIPASE, BLOOD: Lipase: 180 U/L — ABNORMAL HIGH (ref 22–51)

## 2014-11-16 MED ORDER — ONDANSETRON HCL 4 MG PO TABS: 4.0000 mg | ORAL_TABLET | Freq: Four times a day (QID) | ORAL | Status: DC | PRN

## 2014-11-16 MED ORDER — PHENOBARBITAL 97.2 MG PO TABS
97.2000 mg | ORAL_TABLET | Freq: Two times a day (BID) | ORAL | Status: DC
  Administered 2014-11-17 – 2014-11-20 (×8): 97.2 mg via ORAL
  Filled 2014-11-16 (×8): qty 1

## 2014-11-16 MED ORDER — ONDANSETRON HCL 4 MG/2ML IJ SOLN
4.0000 mg | Freq: Four times a day (QID) | INTRAMUSCULAR | Status: DC | PRN
Start: 2014-11-16 — End: 2014-11-20

## 2014-11-16 MED ORDER — HYDRALAZINE HCL 20 MG/ML IJ SOLN
10.0000 mg | Freq: Four times a day (QID) | INTRAMUSCULAR | Status: DC | PRN
  Administered 2014-11-19 (×3): 10 mg via INTRAVENOUS
  Filled 2014-11-16 (×3): qty 1

## 2014-11-16 MED ORDER — SODIUM CHLORIDE 0.9 % IV SOLN
Freq: Once | INTRAVENOUS | Status: AC
  Administered 2014-11-16: 1000 mL via INTRAVENOUS

## 2014-11-16 MED ORDER — ACETAMINOPHEN 325 MG PO TABS
650.0000 mg | ORAL_TABLET | Freq: Four times a day (QID) | ORAL | Status: DC | PRN
Start: 2014-11-16 — End: 2014-11-20

## 2014-11-16 MED ORDER — PANCRELIPASE (LIP-PROT-AMYL) 36000-114000 UNITS PO CPEP
108000.0000 [IU] | ORAL_CAPSULE | Freq: Three times a day (TID) | ORAL | Status: DC
  Administered 2014-11-17 – 2014-11-20 (×10): 108000 [IU] via ORAL
  Filled 2014-11-16 (×10): qty 3

## 2014-11-16 MED ORDER — ENOXAPARIN SODIUM 40 MG/0.4ML ~~LOC~~ SOLN
40.0000 mg | SUBCUTANEOUS | Status: DC
  Administered 2014-11-17 – 2014-11-19 (×4): 40 mg via SUBCUTANEOUS
  Filled 2014-11-16 (×4): qty 0.4

## 2014-11-16 MED ORDER — MORPHINE SULFATE 2 MG/ML IJ SOLN: 2.0000 mg | INTRAMUSCULAR | Status: DC | PRN

## 2014-11-16 MED ORDER — LEVETIRACETAM 500 MG PO TABS
1000.0000 mg | ORAL_TABLET | Freq: Two times a day (BID) | ORAL | Status: DC
  Administered 2014-11-17 – 2014-11-20 (×8): 1000 mg via ORAL
  Filled 2014-11-16 (×8): qty 2

## 2014-11-16 MED ORDER — LOSARTAN POTASSIUM 50 MG PO TABS
100.0000 mg | ORAL_TABLET | Freq: Every morning | ORAL | Status: DC
  Administered 2014-11-17 – 2014-11-20 (×4): 100 mg via ORAL
  Filled 2014-11-16 (×4): qty 2

## 2014-11-16 MED ORDER — SODIUM CHLORIDE 0.9 % IV SOLN
INTRAVENOUS | Status: DC
  Administered 2014-11-16: 1 mL via INTRAVENOUS
  Administered 2014-11-17: 15:00:00 via INTRAVENOUS
  Administered 2014-11-18: 1 mL via INTRAVENOUS
  Administered 2014-11-18: 16:00:00 via INTRAVENOUS
  Administered 2014-11-19: 1 mL via INTRAVENOUS
  Administered 2014-11-19: 17:00:00 via INTRAVENOUS

## 2014-11-16 MED ORDER — IOHEXOL 300 MG/ML  SOLN
100.0000 mL | Freq: Once | INTRAMUSCULAR | Status: AC | PRN
  Administered 2014-11-16: 100 mL via INTRAVENOUS

## 2014-11-16 MED ORDER — METOPROLOL SUCCINATE ER 50 MG PO TB24
50.0000 mg | ORAL_TABLET | Freq: Every morning | ORAL | Status: DC
  Administered 2014-11-17 – 2014-11-20 (×4): 50 mg via ORAL
  Filled 2014-11-16 (×4): qty 1

## 2014-11-16 MED ORDER — ONDANSETRON HCL 4 MG/2ML IJ SOLN
4.0000 mg | Freq: Once | INTRAMUSCULAR | Status: AC
  Administered 2014-11-16: 4 mg via INTRAVENOUS
  Filled 2014-11-16: qty 2

## 2014-11-16 MED ORDER — PRAVASTATIN SODIUM 10 MG PO TABS
20.0000 mg | ORAL_TABLET | Freq: Every day | ORAL | Status: DC
  Administered 2014-11-17 – 2014-11-20 (×4): 20 mg via ORAL
  Filled 2014-11-16 (×4): qty 2

## 2014-11-16 MED ORDER — SODIUM CHLORIDE 0.9 % IV BOLUS (SEPSIS)
500.0000 mL | Freq: Once | INTRAVENOUS | Status: AC
  Administered 2014-11-16: 500 mL via INTRAVENOUS

## 2014-11-16 MED ORDER — HYDROMORPHONE HCL 1 MG/ML IJ SOLN
1.0000 mg | INTRAMUSCULAR | Status: DC | PRN
  Administered 2014-11-16: 1 mg via INTRAVENOUS
  Filled 2014-11-16: qty 1

## 2014-11-16 MED ORDER — INSULIN ASPART 100 UNIT/ML ~~LOC~~ SOLN
0.0000 [IU] | SUBCUTANEOUS | Status: DC
  Administered 2014-11-17: 2 [IU] via SUBCUTANEOUS
  Administered 2014-11-17: 1 [IU] via SUBCUTANEOUS
  Administered 2014-11-17: 2 [IU] via SUBCUTANEOUS
  Administered 2014-11-17 (×2): 1 [IU] via SUBCUTANEOUS
  Administered 2014-11-18 (×3): 2 [IU] via SUBCUTANEOUS
  Administered 2014-11-18: 1 [IU] via SUBCUTANEOUS
  Administered 2014-11-19: 3 [IU] via SUBCUTANEOUS
  Administered 2014-11-19: 2 [IU] via SUBCUTANEOUS
  Administered 2014-11-19: 3 [IU] via SUBCUTANEOUS
  Administered 2014-11-19 (×3): 1 [IU] via SUBCUTANEOUS
  Administered 2014-11-20: 3 [IU] via SUBCUTANEOUS
  Administered 2014-11-20: 1 [IU] via SUBCUTANEOUS
  Administered 2014-11-20 (×2): 2 [IU] via SUBCUTANEOUS

## 2014-11-16 MED ORDER — ACETAMINOPHEN 650 MG RE SUPP: 650.0000 mg | Freq: Four times a day (QID) | RECTAL | Status: DC | PRN

## 2014-11-16 MED ORDER — PANCRELIPASE (LIP-PROT-AMYL) 36000-114000 UNITS PO CPEP
36000.0000 [IU] | ORAL_CAPSULE | Freq: Two times a day (BID) | ORAL | Status: DC
  Administered 2014-11-18 – 2014-11-20 (×5): 36000 [IU] via ORAL
  Filled 2014-11-16 (×6): qty 1
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
## 2014-11-16 NOTE — ED Notes (Signed)
Pt was admited last week for abdominal pain. Pt states pain medications not working. Pain worse today reason for return.

## 2014-11-16 NOTE — ED Notes (Signed)
   11/16/14 1948  Abdominal  Gastrointestinal ([facility]) X  Abdomen Inspection Soft  Tenderness Tender  Last Bowel Movement Date 11/15/14  pt dx w/ pancreatitis last week states mid abdominal pain & medications not working

## 2014-11-17 LAB — GLUCOSE, CAPILLARY
GLUCOSE-CAPILLARY: 169 mg/dL — AB (ref 65–99)
GLUCOSE-CAPILLARY: 129 mg/dL — AB (ref 65–99)
GLUCOSE-CAPILLARY: 177 mg/dL — AB (ref 65–99)
Glucose-Capillary: 116 mg/dL — ABNORMAL HIGH (ref 65–99)
Glucose-Capillary: 132 mg/dL — ABNORMAL HIGH (ref 65–99)
Glucose-Capillary: 139 mg/dL — ABNORMAL HIGH (ref 65–99)
Glucose-Capillary: 150 mg/dL — ABNORMAL HIGH (ref 65–99)
Glucose-Capillary: 213 mg/dL — ABNORMAL HIGH (ref 65–99)

## 2014-11-17 LAB — COMPREHENSIVE METABOLIC PANEL
ALBUMIN: 3.2 g/dL — AB (ref 3.5–5.0)
ALK PHOS: 92 U/L (ref 38–126)
ALT: 21 U/L (ref 17–63)
AST: 18 U/L (ref 15–41)
Anion gap: 9 (ref 5–15)
BUN: 11 mg/dL (ref 6–20)
CO2: 25 mmol/L (ref 22–32)
CREATININE: 0.87 mg/dL (ref 0.61–1.24)
Calcium: 8.3 mg/dL — ABNORMAL LOW (ref 8.9–10.3)
Chloride: 105 mmol/L (ref 101–111)
Glucose, Bld: 132 mg/dL — ABNORMAL HIGH (ref 65–99)
Potassium: 3.3 mmol/L — ABNORMAL LOW (ref 3.5–5.1)
Sodium: 139 mmol/L (ref 135–145)
TOTAL PROTEIN: 6.5 g/dL (ref 6.5–8.1)
Total Bilirubin: 0.3 mg/dL (ref 0.3–1.2)

## 2014-11-17 LAB — CBC
HCT: 48.2 % (ref 39.0–52.0)
Hemoglobin: 16.1 g/dL (ref 13.0–17.0)
MCH: 30.4 pg (ref 26.0–34.0)
MCHC: 33.4 g/dL (ref 30.0–36.0)
MCV: 90.9 fL (ref 78.0–100.0)
PLATELETS: 171 10*3/uL (ref 150–400)
RBC: 5.3 MIL/uL (ref 4.22–5.81)
RDW: 13.7 % (ref 11.5–15.5)
WBC: 13 10*3/uL — ABNORMAL HIGH (ref 4.0–10.5)

## 2014-11-17 LAB — LIPASE, BLOOD: Lipase: 154 U/L — ABNORMAL HIGH (ref 22–51)

## 2014-11-17 MED ORDER — MORPHINE SULFATE 2 MG/ML IJ SOLN
2.0000 mg | INTRAMUSCULAR | Status: DC | PRN
  Administered 2014-11-17: 2 mg via INTRAVENOUS
  Filled 2014-11-17: qty 1

## 2014-11-17 MED ORDER — CHLORHEXIDINE GLUCONATE 0.12 % MT SOLN
15.0000 mL | Freq: Two times a day (BID) | OROMUCOSAL | Status: DC
  Administered 2014-11-17 – 2014-11-20 (×7): 15 mL via OROMUCOSAL
  Filled 2014-11-17 (×4): qty 15

## 2014-11-17 MED ORDER — CETYLPYRIDINIUM CHLORIDE 0.05 % MT LIQD
7.0000 mL | Freq: Two times a day (BID) | OROMUCOSAL | Status: DC
  Administered 2014-11-17 – 2014-11-20 (×6): 7 mL via OROMUCOSAL

## 2014-11-17 MED ORDER — MORPHINE SULFATE 2 MG/ML IJ SOLN
2.0000 mg | INTRAMUSCULAR | Status: DC | PRN
  Administered 2014-11-17 – 2014-11-19 (×5): 2 mg via INTRAVENOUS
  Filled 2014-11-17 (×5): qty 1
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
## 2014-11-18 DIAGNOSIS — G40909 Epilepsy, unspecified, not intractable, without status epilepticus: Secondary | ICD-10-CM

## 2014-11-18 DIAGNOSIS — Z720 Tobacco use: Secondary | ICD-10-CM

## 2014-11-18 DIAGNOSIS — I10 Essential (primary) hypertension: Secondary | ICD-10-CM

## 2014-11-18 LAB — GLUCOSE, CAPILLARY
GLUCOSE-CAPILLARY: 137 mg/dL — AB (ref 65–99)
GLUCOSE-CAPILLARY: 118 mg/dL — AB (ref 65–99)
GLUCOSE-CAPILLARY: 190 mg/dL — AB (ref 65–99)
Glucose-Capillary: 152 mg/dL — ABNORMAL HIGH (ref 65–99)
Glucose-Capillary: 199 mg/dL — ABNORMAL HIGH (ref 65–99)

## 2014-11-18 LAB — CBC
HCT: 49.2 % (ref 39.0–52.0)
Hemoglobin: 17 g/dL (ref 13.0–17.0)
MCH: 31 pg (ref 26.0–34.0)
MCHC: 34.6 g/dL (ref 30.0–36.0)
MCV: 89.6 fL (ref 78.0–100.0)
PLATELETS: 172 10*3/uL (ref 150–400)
RBC: 5.49 MIL/uL (ref 4.22–5.81)
RDW: 13.2 % (ref 11.5–15.5)
WBC: 10.1 10*3/uL (ref 4.0–10.5)
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
## 2014-11-19 DIAGNOSIS — K850 Idiopathic acute pancreatitis: Secondary | ICD-10-CM

## 2014-11-19 DIAGNOSIS — E118 Type 2 diabetes mellitus with unspecified complications: Secondary | ICD-10-CM

## 2014-11-19 LAB — GLUCOSE, CAPILLARY
GLUCOSE-CAPILLARY: 155 mg/dL — AB (ref 65–99)
GLUCOSE-CAPILLARY: 144 mg/dL — AB (ref 65–99)
Glucose-Capillary: 129 mg/dL — ABNORMAL HIGH (ref 65–99)
Glucose-Capillary: 138 mg/dL — ABNORMAL HIGH (ref 65–99)
Glucose-Capillary: 235 mg/dL — ABNORMAL HIGH (ref 65–99)

## 2014-11-19 MED ORDER — OXYCODONE HCL 5 MG PO TABS
5.0000 mg | ORAL_TABLET | ORAL | Status: DC | PRN
  Filled 2014-11-19: qty 1

## 2014-11-19 NOTE — Care Management Important Message (Signed)
Important Message  Patient Details  Name: [name] MRN: [id number] Date of Birth: 10-Aug-1960   Medicare Important Message Given:  Yes-second notification given    Sherald Barge, RN 11/19/2014, 4:12 PM
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
## 2014-11-20 LAB — GLUCOSE, CAPILLARY
Glucose-Capillary: 148 mg/dL — ABNORMAL HIGH (ref 65–99)
Glucose-Capillary: 167 mg/dL — ABNORMAL HIGH (ref 65–99)
Glucose-Capillary: 182 mg/dL — ABNORMAL HIGH (ref 65–99)
Glucose-Capillary: 209 mg/dL — ABNORMAL HIGH (ref 65–99)
Glucose-Capillary: 228 mg/dL — ABNORMAL HIGH (ref 65–99)

## 2014-11-20 MED ORDER — ONDANSETRON HCL 4 MG PO TABS: 4.0000 mg | ORAL_TABLET | Freq: Four times a day (QID) | ORAL | Status: DC | PRN

## 2014-11-20 NOTE — Discharge Planning (Addendum)
Pt IV removed. Pt given, explained and educated discharge papers. Told of needed FU appt and script at pharm. VSS and RN assessment revealed stability for discharge. Pt will be wheeled to car and caregiver transporting home via car. CG arrived and indicated that pt had medic alert necklace on when came to hospital and is not wearing it now (for DC)? RN questioned techs and also explained that he had not seen it on assessment today. Nothing documented in records either.  Pt states it is a white square with blue button in middle.  Management and ED informed of missing item.

## 2014-11-20 NOTE — Care Management Important Message (Signed)
Important Message  Patient Details  Name: [name] MRN: [id number] Date of Birth: 12-06-1960   Medicare Important Message Given:  Yes-third notification given    Joylene Draft, RN 11/20/2014, 12:42 PM
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
## 2014-11-20 NOTE — Care Management Note (Signed)
Case Management Note  Patient Details  Name: GAREK SCHUNEMAN MRN: [id number] Date of Birth: 28-Jun-1960  Subjective/Objective:                  Pt admitted from home with pancreatitis. Pt lives alone and has an CAP aide 7 days a week, 3 hours a day. Pt has a hospital bed and electric w/c,shower bench for home use.  Pts PCP is arranging hoyer lift for pt with [facility].  Action/Plan: Pt for discharge home today. No Cm needs noted.  Expected Discharge Date:  11/18/14               Expected Discharge Plan:  Home/Self Care  In-House Referral:  NA  Discharge planning Services  CM Consult  Post Acute Care Choice:  NA Choice offered to:  NA  DME Arranged:    DME Agency:     HH Arranged:    HH Agency:     Status of Service:  Completed, signed off  Medicare Important Message Given:  Yes-second notification given Date Medicare IM Given:    Medicare IM give by:    Date Additional Medicare IM Given:    Additional Medicare Important Message give by:     If discussed at [place] of Stay Meetings, dates discussed:    Additional Comments:  Joylene Draft, RN 11/20/2014, 12:39 PM

## 2015-01-23 ENCOUNTER — Other Ambulatory Visit: Payer: Self-pay

## 2015-01-23 MED ORDER — ERGOCALCIFEROL 1.25 MG (50000 UT) PO CAPS: 50000.0000 [IU] | ORAL_CAPSULE | ORAL | Status: DC

## 2015-02-18 ENCOUNTER — Other Ambulatory Visit: Payer: Self-pay

## 2015-02-18 MED ORDER — PANCRELIPASE (LIP-PROT-AMYL) 36000-114000 UNITS PO CPEP: 108000.0000 [IU] | ORAL_CAPSULE | Freq: Three times a day (TID) | ORAL | Status: AC

## 2015-03-15 ENCOUNTER — Emergency Department (HOSPITAL_COMMUNITY): Payer: Medicare Other

## 2015-03-15 ENCOUNTER — Emergency Department (HOSPITAL_COMMUNITY)
Admission: EM | Admit: 2015-03-15 | Discharge: 2015-03-16 | Disposition: A | Discharge status: Home / Self Care | Payer: Medicare Other | Attending: Emergency Medicine | Admitting: Emergency Medicine

## 2015-03-15 ENCOUNTER — Encounter (HOSPITAL_COMMUNITY): Payer: Self-pay | Admitting: Emergency Medicine

## 2015-03-15 DIAGNOSIS — Z8619 Personal history of other infectious and parasitic diseases: Secondary | ICD-10-CM | POA: Diagnosis not present

## 2015-03-15 DIAGNOSIS — Z91040 Latex allergy status: Secondary | ICD-10-CM | POA: Insufficient documentation

## 2015-03-15 DIAGNOSIS — Z8719 Personal history of other diseases of the digestive system: Secondary | ICD-10-CM | POA: Diagnosis not present

## 2015-03-15 DIAGNOSIS — Z8669 Personal history of other diseases of the nervous system and sense organs: Secondary | ICD-10-CM | POA: Diagnosis not present

## 2015-03-15 DIAGNOSIS — F17210 Nicotine dependence, cigarettes, uncomplicated: Secondary | ICD-10-CM | POA: Insufficient documentation

## 2015-03-15 DIAGNOSIS — I10 Essential (primary) hypertension: Secondary | ICD-10-CM | POA: Diagnosis not present

## 2015-03-15 DIAGNOSIS — E119 Type 2 diabetes mellitus without complications: Secondary | ICD-10-CM | POA: Diagnosis not present

## 2015-03-15 DIAGNOSIS — R066 Hiccough: Secondary | ICD-10-CM | POA: Diagnosis present

## 2015-03-15 DIAGNOSIS — R110 Nausea: Secondary | ICD-10-CM | POA: Diagnosis not present

## 2015-03-15 DIAGNOSIS — Z7984 Long term (current) use of oral hypoglycemic drugs: Secondary | ICD-10-CM | POA: Insufficient documentation

## 2015-03-15 DIAGNOSIS — R918 Other nonspecific abnormal finding of lung field: Secondary | ICD-10-CM | POA: Diagnosis not present

## 2015-03-15 DIAGNOSIS — Z79899 Other long term (current) drug therapy: Secondary | ICD-10-CM | POA: Insufficient documentation

## 2015-03-15 DIAGNOSIS — Z87828 Personal history of other (healed) physical injury and trauma: Secondary | ICD-10-CM | POA: Insufficient documentation

## 2015-03-15 DIAGNOSIS — R109 Unspecified abdominal pain: Secondary | ICD-10-CM | POA: Insufficient documentation

## 2015-03-15 DIAGNOSIS — E7800 Pure hypercholesterolemia, unspecified: Secondary | ICD-10-CM | POA: Diagnosis not present

## 2015-03-15 LAB — COMPREHENSIVE METABOLIC PANEL
ALBUMIN: 3.6 g/dL (ref 3.5–5.0)
ALT: 20 U/L (ref 17–63)
ANION GAP: 12 (ref 5–15)
AST: 18 U/L (ref 15–41)
Alkaline Phosphatase: 136 U/L — ABNORMAL HIGH (ref 38–126)
BILIRUBIN TOTAL: 0.2 mg/dL — AB (ref 0.3–1.2)
BUN: 23 mg/dL — ABNORMAL HIGH (ref 6–20)
CO2: 24 mmol/L (ref 22–32)
Calcium: 9.1 mg/dL (ref 8.9–10.3)
Chloride: 100 mmol/L — ABNORMAL LOW (ref 101–111)
Creatinine, Ser: 1.27 mg/dL — ABNORMAL HIGH (ref 0.61–1.24)
GFR calc Af Amer: >60 mL/min (ref >60)
Glucose, Bld: 219 mg/dL — ABNORMAL HIGH (ref 65–99)
POTASSIUM: 3.4 mmol/L — AB (ref 3.5–5.1)
Sodium: 136 mmol/L (ref 135–145)
TOTAL PROTEIN: 7.7 g/dL (ref 6.5–8.1)

## 2015-03-15 LAB — CBC WITH DIFFERENTIAL/PLATELET
BASOS PCT: 0 %
Basophils Absolute: 0 10*3/uL (ref 0.0–0.1)
Eosinophils Absolute: 0.2 10*3/uL (ref 0.0–0.7)
Eosinophils Relative: 2 %
HCT: 47.6 % (ref 39.0–52.0)
Hemoglobin: 16.6 g/dL (ref 13.0–17.0)
Lymphocytes Relative: 19 %
Lymphs Abs: 2 10*3/uL (ref 0.7–4.0)
MCH: 30.3 pg (ref 26.0–34.0)
MCHC: 34.9 g/dL (ref 30.0–36.0)
MCV: 86.9 fL (ref 78.0–100.0)
MONO ABS: 0.8 10*3/uL (ref 0.1–1.0)
Monocytes Relative: 7 %
Neutro Abs: 7.5 10*3/uL (ref 1.7–7.7)
Neutrophils Relative %: 72 %
Platelets: 161 10*3/uL (ref 150–400)
RBC: 5.48 MIL/uL (ref 4.22–5.81)
RDW: 13.8 % (ref 11.5–15.5)
WBC: 10.5 10*3/uL (ref 4.0–10.5)

## 2015-03-15 LAB — LIPASE, BLOOD: Lipase: 32 U/L (ref 11–51)

## 2015-03-15 MED ORDER — SODIUM CHLORIDE 0.9 % IV BOLUS (SEPSIS)
1000.0000 mL | Freq: Once | INTRAVENOUS | Status: AC
  Administered 2015-03-15: 1000 mL via INTRAVENOUS

## 2015-03-15 MED ORDER — IOHEXOL 300 MG/ML  SOLN
100.0000 mL | Freq: Once | INTRAMUSCULAR | Status: AC | PRN
  Administered 2015-03-15: 100 mL via INTRAVENOUS
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
## 2015-03-15 NOTE — ED Notes (Signed)
Dr. Dayna Barker informed of pt's hiccups.

## 2015-03-15 NOTE — ED Notes (Signed)
Pt reports hiccups x 1 week, now has R sided pain from the hiccups. Pt also c/o diarrhea that started 2 hours ago.

## 2015-03-19 ENCOUNTER — Encounter (HOSPITAL_COMMUNITY): Payer: Self-pay | Admitting: Hematology & Oncology

## 2015-03-19 ENCOUNTER — Encounter (HOSPITAL_COMMUNITY): Payer: Medicare Other | Attending: Hematology & Oncology | Admitting: Hematology & Oncology

## 2015-03-19 VITALS — BP 149/84 | HR 80 | Temp 98.5°F | Resp 20 | Ht 74.0 in | Wt 220.0 lb

## 2015-03-19 DIAGNOSIS — R911 Solitary pulmonary nodule: Secondary | ICD-10-CM | POA: Diagnosis present

## 2015-03-19 NOTE — Patient Instructions (Addendum)
[place] at [facility] Discharge Instructions  RECOMMENDATIONS MADE BY THE CONSULTANT AND ANY TEST RESULTS WILL BE SENT TO YOUR REFERRING PHYSICIAN.   Exam completed by Dr Whitney Muse today PET scan first- this will show [name] if there is anything anywhere else in your body Then we will make a referral to the cardiothoracic surgeon   [name] is our Education officer, museum and we are going to contact her to see if she can help [name] with your transportation [name] is our nurse navigator, she will help get your started with your journey here in the cancer center Return to see the doctor after you have seen Dr [name] Please call the clinic if you have any questions or concerns   PET scan scheduled for Mar 28, 2015 @ [facility] Radiology Department. Arrive @ 11am. Nothing to eat or drink past midnight on 03/27/15. No coffee, no sugary drinks, no sugar free drinks, no candy, no gum, no sugarfee candy/gum after midnight either.  Wait until after the PET exam.This is extremely important. You may take your morning meds with a small sip of WATER only! But NO blood sugar medications @ all until after the PET scan.  Transportation has been set up for your PET scan with [facility]. [facility] to call you to let you know what time to be ready.  A referral has been made to Dr. [name] but we are waiting on an appointment date & time.    Thank you for choosing [place] at [facility] to provide your oncology and hematology care.  To afford each patient quality time with our provider, please arrive at least 15 minutes before your scheduled appointment time.    You need to re-schedule your appointment should you arrive 10 or more minutes late.  We strive to give you quality time with our providers, and arriving late affects you and other patients whose appointments are after yours.  Also, if you no show three or more times for appointments you may be dismissed from the clinic at  the providers discretion.     Again, thank you for choosing [facility].  Our hope is that these requests will decrease the amount of time that you wait before being seen by our physicians.       _____________________________________________________________  Should you have questions after your visit to [facility], please contact our office at [phone] between the hours of 8:30 a.m. and 4:30 p.m.  Voicemails left after 4:30 p.m. will not be returned until the following business day.  For prescription refill requests, have your pharmacy contact our office.    PET Scan A PET scan, also called positron emission tomography, is a test that creates pictures of the inside of the body. A PET scan requires a small dose of a harmless radioactive material to be injected into a vein. When this material combines with certain substances in the body, it produces tiny particles that can be detected by a scanner and converted into pictures.  The pictures created during a PET scan can be used to study a disease. They are often used to study cancer and cancer therapy. The colors and brightness on the pictures show different levels of organ and tissue function. For example, cancer tissue appears brighter than normal tissue on a PET scan picture. LET [facility] CARE PROVIDER KNOW ABOUT:   Any allergies you have.  All medicines you are taking, including vitamins, herbs, eye  drops, creams, and over-the-counter medicines.  Previous problems you or members of your family have had with the use of anesthetics.  Any blood disorders you have.  Previous surgeries you have had.  Medical conditions you have.  If you are afraid of cramped spaces (claustrophobic). If claustrophobia is a problem, it usually can be relieved with mild sedatives or antianxiety medicines.  If you have trouble staying still for long periods of time. BEFORE THE PROCEDURE   Do not eat or drink anything after  midnight on the night before the procedure or as directed by your health care provider.  Take medicines only as directed by your health care provider.  If you have diabetes, ask your health care provider for diet guidelines to control sugar (glucose) levels on the day of the test. PROCEDURE   A small amount of radioactive material will be injected into a vein. The test will begin 30-60 minutes after the injection, when the material has traveled around your body.  You will lie on a cushioned table, and the table will be moved through the center of a machine that looks like a large donut. It will take about 30-60 minutes for the machine to produce pictures of your body. You will need to stay still during this time. AFTER THE PROCEDURE  You may resume your normal diet and activities.  Drink several 8 oz glasses of water following the test to flush the radioactive material out of your body.   This information is not intended to replace advice given to you by your health care provider. Make sure you discuss any questions you have with your health care provider.   Document Released: 10/04/2002 Document Revised: 04/20/2014 Document Reviewed: 07/12/2013 Elsevier Interactive Patient Education [employer].

## 2015-03-28 ENCOUNTER — Encounter (HOSPITAL_COMMUNITY)
Admission: RE | Admit: 2015-03-28 | Discharge: 2015-03-28 | Disposition: A | Discharge status: Home / Self Care | Payer: Medicare Other | Source: Ambulatory Visit | Attending: Hematology & Oncology | Admitting: Hematology & Oncology

## 2015-03-28 ENCOUNTER — Encounter: Payer: Medicare Other | Admitting: Thoracic Surgery (Cardiothoracic Vascular Surgery)

## 2015-03-28 DIAGNOSIS — R911 Solitary pulmonary nodule: Secondary | ICD-10-CM | POA: Diagnosis not present

## 2015-03-28 LAB — GLUCOSE, CAPILLARY: Glucose-Capillary: 145 mg/dL — ABNORMAL HIGH (ref 65–99)

## 2015-03-28 MED ORDER — FLUDEOXYGLUCOSE F - 18 (FDG) INJECTION
10.9000 | Freq: Once | INTRAVENOUS | Status: AC | PRN
  Administered 2015-03-28: 10.9 via INTRAVENOUS

## 2015-03-29 ENCOUNTER — Other Ambulatory Visit: Payer: Self-pay | Admitting: Radiology

## 2015-03-29 ENCOUNTER — Ambulatory Visit: Payer: Self-pay | Admitting: "Endocrinology

## 2015-03-29 ENCOUNTER — Encounter: Payer: Medicare Other | Admitting: Thoracic Surgery (Cardiothoracic Vascular Surgery)

## 2015-04-01 ENCOUNTER — Ambulatory Visit (HOSPITAL_COMMUNITY): Admission: RE | Admit: 2015-04-01 | Payer: Medicare Other | Source: Ambulatory Visit

## 2015-04-09 ENCOUNTER — Ambulatory Visit (HOSPITAL_COMMUNITY): Payer: Medicare Other

## 2015-04-10 ENCOUNTER — Other Ambulatory Visit: Payer: Self-pay | Admitting: Radiology

## 2015-04-11 ENCOUNTER — Encounter (HOSPITAL_COMMUNITY): Payer: Self-pay | Admitting: Hematology & Oncology

## 2015-04-11 ENCOUNTER — Other Ambulatory Visit: Payer: Self-pay | Admitting: Radiology

## 2015-04-11 ENCOUNTER — Encounter (HOSPITAL_BASED_OUTPATIENT_CLINIC_OR_DEPARTMENT_OTHER): Payer: Medicare Other | Admitting: Hematology & Oncology

## 2015-04-11 VITALS — BP 127/73 | HR 85 | Temp 97.9°F | Resp 18

## 2015-04-11 DIAGNOSIS — J949 Pleural condition, unspecified: Secondary | ICD-10-CM | POA: Diagnosis present

## 2015-04-11 DIAGNOSIS — F1021 Alcohol dependence, in remission: Secondary | ICD-10-CM | POA: Diagnosis not present

## 2015-04-11 DIAGNOSIS — Z0000 Encounter for general adult medical examination without abnormal findings: Secondary | ICD-10-CM

## 2015-04-11 DIAGNOSIS — Z23 Encounter for immunization: Secondary | ICD-10-CM

## 2015-04-11 DIAGNOSIS — R911 Solitary pulmonary nodule: Secondary | ICD-10-CM

## 2015-04-11 DIAGNOSIS — Z720 Tobacco use: Secondary | ICD-10-CM

## 2015-04-11 MED ORDER — INFLUENZA VAC SPLIT QUAD 0.5 ML IM SUSY
0.5000 mL | PREFILLED_SYRINGE | Freq: Once | INTRAMUSCULAR | Status: DC
  Filled 2015-04-11: qty 0.5

## 2015-04-11 MED ORDER — INFLUENZA VAC SPLIT QUAD 0.5 ML IM SUSY
0.5000 mL | PREFILLED_SYRINGE | Freq: Once | INTRAMUSCULAR | Status: AC
  Administered 2015-04-11: 0.5 mL via INTRAMUSCULAR
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
## 2015-04-11 NOTE — Patient Instructions (Addendum)
[place] at [facility] Discharge Instructions  RECOMMENDATIONS MADE BY THE CONSULTANT AND ANY TEST RESULTS WILL BE SENT TO YOUR REFERRING PHYSICIAN.   Exam completed by Dr Whitney Muse today Biopsy tomorrow Flu shot today You can take some tylenol if you get a little fever from the flu shot Return to see the doctor next friday Please call the clinic if you have any questions or concerns    Thank you for choosing [place] at [facility] to provide your oncology and hematology care.  To afford each patient quality time with our provider, please arrive at least 15 minutes before your scheduled appointment time.    You need to re-schedule your appointment should you arrive 10 or more minutes late.  We strive to give you quality time with our providers, and arriving late affects you and other patients whose appointments are after yours.  Also, if you no show three or more times for appointments you may be dismissed from the clinic at the providers discretion.     Again, thank you for choosing [facility].  Our hope is that these requests will decrease the amount of time that you wait before being seen by our physicians.       _____________________________________________________________  Should you have questions after your visit to [facility], please contact our office at [phone] between the hours of 8:30 a.m. and 4:30 p.m.  Voicemails left after 4:30 p.m. will not be returned until the following business day.  For prescription refill requests, have your pharmacy contact our office.

## 2015-04-11 NOTE — Progress Notes (Signed)
Promise R Polo  Flu shot today

## 2015-04-12 ENCOUNTER — Encounter (HOSPITAL_COMMUNITY)
Admission: RE | Admit: 2015-04-12 | Discharge: 2015-04-12 | Disposition: A | Discharge status: Home / Self Care | Payer: Medicare Other | Source: Ambulatory Visit | Attending: Interventional Radiology | Admitting: Interventional Radiology

## 2015-04-12 ENCOUNTER — Ambulatory Visit (HOSPITAL_COMMUNITY)
Admission: RE | Admit: 2015-04-12 | Discharge: 2015-04-12 | Disposition: A | Discharge status: Home / Self Care | Payer: Medicare Other | Source: Ambulatory Visit | Attending: Hematology & Oncology | Admitting: Hematology & Oncology

## 2015-04-12 ENCOUNTER — Encounter (HOSPITAL_COMMUNITY): Payer: Self-pay

## 2015-04-12 DIAGNOSIS — Z9889 Other specified postprocedural states: Secondary | ICD-10-CM

## 2015-04-12 DIAGNOSIS — E119 Type 2 diabetes mellitus without complications: Secondary | ICD-10-CM | POA: Insufficient documentation

## 2015-04-12 DIAGNOSIS — R079 Chest pain, unspecified: Secondary | ICD-10-CM | POA: Diagnosis not present

## 2015-04-12 DIAGNOSIS — R918 Other nonspecific abnormal finding of lung field: Secondary | ICD-10-CM | POA: Insufficient documentation

## 2015-04-12 DIAGNOSIS — F17210 Nicotine dependence, cigarettes, uncomplicated: Secondary | ICD-10-CM | POA: Diagnosis not present

## 2015-04-12 DIAGNOSIS — I10 Essential (primary) hypertension: Secondary | ICD-10-CM | POA: Diagnosis not present

## 2015-04-12 DIAGNOSIS — E7800 Pure hypercholesterolemia, unspecified: Secondary | ICD-10-CM | POA: Insufficient documentation

## 2015-04-12 DIAGNOSIS — R911 Solitary pulmonary nodule: Secondary | ICD-10-CM

## 2015-04-12 LAB — BASIC METABOLIC PANEL
ANION GAP: 9 (ref 5–15)
BUN: 21 mg/dL — ABNORMAL HIGH (ref 6–20)
CO2: 25 mmol/L (ref 22–32)
Calcium: 8.9 mg/dL (ref 8.9–10.3)
Chloride: 102 mmol/L (ref 101–111)
Creatinine, Ser: 1.26 mg/dL — ABNORMAL HIGH (ref 0.61–1.24)
GFR calc Af Amer: >60 mL/min (ref >60)
GLUCOSE: 120 mg/dL — AB (ref 65–99)
POTASSIUM: 5.6 mmol/L — AB (ref 3.5–5.1)
Sodium: 136 mmol/L (ref 135–145)

## 2015-04-12 LAB — GLUCOSE, CAPILLARY: Glucose-Capillary: 109 mg/dL — ABNORMAL HIGH (ref 65–99)

## 2015-04-12 LAB — CBC WITH DIFFERENTIAL/PLATELET
BASOS ABS: 0 10*3/uL (ref 0.0–0.1)
Basophils Relative: 0 %
Eosinophils Absolute: 0.2 10*3/uL (ref 0.0–0.7)
Eosinophils Relative: 2 %
HEMATOCRIT: 48 % (ref 39.0–52.0)
Hemoglobin: 16.2 g/dL (ref 13.0–17.0)
LYMPHS PCT: 17 %
Lymphs Abs: 1.4 10*3/uL (ref 0.7–4.0)
MCH: 29.7 pg (ref 26.0–34.0)
MCHC: 33.8 g/dL (ref 30.0–36.0)
MCV: 88.1 fL (ref 78.0–100.0)
MONO ABS: 0.7 10*3/uL (ref 0.1–1.0)
Monocytes Relative: 8 %
NEUTROS ABS: 6.2 10*3/uL (ref 1.7–7.7)
Neutrophils Relative %: 73 %
Platelets: 163 10*3/uL (ref 150–400)
RBC: 5.45 MIL/uL (ref 4.22–5.81)
RDW: 14.3 % (ref 11.5–15.5)
WBC: 8.5 10*3/uL (ref 4.0–10.5)

## 2015-04-12 LAB — PROTIME-INR
INR: 1.03 (ref 0.00–1.49)
PROTHROMBIN TIME: 13.7 s (ref 11.6–15.2)

## 2015-04-12 LAB — APTT: APTT: 31 s (ref 24–37)

## 2015-04-12 MED ORDER — FENTANYL CITRATE (PF) 100 MCG/2ML IJ SOLN
INTRAMUSCULAR | Status: AC | PRN
  Administered 2015-04-12 (×2): 50 ug via INTRAVENOUS

## 2015-04-12 MED ORDER — FENTANYL CITRATE (PF) 100 MCG/2ML IJ SOLN
INTRAMUSCULAR | Status: AC
  Filled 2015-04-12: qty 4

## 2015-04-12 MED ORDER — MIDAZOLAM HCL 2 MG/2ML IJ SOLN
INTRAMUSCULAR | Status: AC
  Filled 2015-04-12: qty 4

## 2015-04-12 MED ORDER — SODIUM CHLORIDE 0.9 % IV SOLN
INTRAVENOUS | Status: DC
  Administered 2015-04-12: 09:00:00 via INTRAVENOUS

## 2015-04-12 MED ORDER — MIDAZOLAM HCL 2 MG/2ML IJ SOLN
INTRAMUSCULAR | Status: AC | PRN
  Administered 2015-04-12 (×2): 1 mg via INTRAVENOUS

## 2015-04-12 MED ORDER — LIDOCAINE HCL 1 % IJ SOLN
INTRAMUSCULAR | Status: AC
  Filled 2015-04-12: qty 20

## 2015-04-12 NOTE — Sedation Documentation (Signed)
Left hand IV accidentally pulled out with CT table.

## 2015-04-12 NOTE — Discharge Instructions (Signed)
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
## 2015-04-12 NOTE — Procedures (Signed)
RUL lung mass Bx 18 g times 2 No comp/EBL

## 2015-04-18 ENCOUNTER — Encounter (HOSPITAL_COMMUNITY): Payer: Self-pay | Admitting: Oncology

## 2015-04-18 NOTE — Progress Notes (Signed)
No show

## 2015-04-19 ENCOUNTER — Ambulatory Visit (HOSPITAL_COMMUNITY): Payer: Medicare Other | Admitting: Oncology

## 2015-04-19 ENCOUNTER — Encounter (HOSPITAL_COMMUNITY): Payer: Self-pay | Admitting: Oncology

## 2015-04-22 ENCOUNTER — Other Ambulatory Visit: Payer: Self-pay | Admitting: "Endocrinology
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
## 2015-04-27 NOTE — Assessment & Plan Note (Addendum)
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  Problem list updated.  I reviewed all of the most recent findings including PET scan results and pathology results from biopsy on 04/12/2015.  He had an appointment with Dr. [name] in mid-December 2016 for this mass, but the consult was cancelled due to Dr. [name] reporting for emergency surgery that day.  I do not see that Mr. Allum has seen Dr. [name] since; therefore, I messaged Dr. [name] through [facility].  He now has an appointment on 05/07/2015 at 4 PM.  The patient was a no-show for his 04/19/2015 appointment with me.  However, I was able to get in touch with Dr. [name] who agreed to get the patient back in with his clinic.  He is now scheduled for an appointment on 05/07/2015 with Dr. [name]. The patient is advised of this appointment and educated on the importance of compliance with this appointment today during the office visit.  Oncology does not have anything to contribute at this time.  Return in ~ 3 weeks for follow-up.

## 2015-04-29 ENCOUNTER — Encounter (HOSPITAL_COMMUNITY): Payer: Self-pay | Admitting: Oncology

## 2015-04-29 ENCOUNTER — Encounter (HOSPITAL_COMMUNITY): Payer: Medicare Other | Attending: Hematology & Oncology | Admitting: Oncology

## 2015-04-29 VITALS — BP 121/65 | HR 77 | Temp 98.1°F | Resp 18

## 2015-04-29 DIAGNOSIS — R918 Other nonspecific abnormal finding of lung field: Secondary | ICD-10-CM | POA: Diagnosis present

## 2015-04-29 NOTE — Patient Instructions (Signed)
..  [place] at [facility] Discharge Instructions  RECOMMENDATIONS MADE BY THE CONSULTANT AND ANY TEST RESULTS WILL BE SENT TO YOUR REFERRING PHYSICIAN.  Exam today per T. Sheldon Silvan PA-C  Appt with Dr. [name] today as scheduled  Return in 2 weeks to see Dr. Whitney Muse  Thank you for choosing [place] at [facility] to provide your oncology and hematology care.  To afford each patient quality time with our provider, please arrive at least 15 minutes before your scheduled appointment time.    You need to re-schedule your appointment should you arrive 10 or more minutes late.  We strive to give you quality time with our providers, and arriving late affects you and other patients whose appointments are after yours.  Also, if you no show three or more times for appointments you may be dismissed from the clinic at the providers discretion.     Again, thank you for choosing [facility].  Our hope is that these requests will decrease the amount of time that you wait before being seen by our physicians.       _____________________________________________________________  Should you have questions after your visit to [facility], please contact our office at [phone] between the hours of 8:30 a.m. and 4:30 p.m.  Voicemails left after 4:30 p.m. will not be returned until the following business day.  For prescription refill requests, have your pharmacy contact our office.

## 2015-04-30 ENCOUNTER — Encounter: Payer: Medicare Other | Admitting: Thoracic Surgery (Cardiothoracic Vascular Surgery)

## 2015-05-07 ENCOUNTER — Encounter: Payer: Self-pay | Admitting: Thoracic Surgery (Cardiothoracic Vascular Surgery)

## 2015-05-07 ENCOUNTER — Institutional Professional Consult (permissible substitution) (INDEPENDENT_AMBULATORY_CARE_PROVIDER_SITE_OTHER): Payer: Medicare Other | Admitting: Thoracic Surgery (Cardiothoracic Vascular Surgery)

## 2015-05-07 VITALS — BP 101/64 | HR 92 | Resp 16 | Ht 74.0 in | Wt 250.0 lb

## 2015-05-07 DIAGNOSIS — C3491 Malignant neoplasm of unspecified part of right bronchus or lung: Secondary | ICD-10-CM | POA: Diagnosis not present

## 2015-05-07 DIAGNOSIS — C3411 Malignant neoplasm of upper lobe, right bronchus or lung: Secondary | ICD-10-CM | POA: Diagnosis not present

## 2015-05-07 HISTORY — DX: Malignant neoplasm of unspecified part of right bronchus or lung: C34.91
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
## 2015-05-08 ENCOUNTER — Telehealth: Payer: Self-pay | Admitting: *Deleted

## 2015-05-08 NOTE — Telephone Encounter (Signed)
Oncology Nurse Navigator Documentation  Oncology Nurse Navigator Flowsheets 05/08/2015  Navigator Location [facility]  Navigator Encounter Type Telephone;Introductory phone call/I received a referral today on [name].  I called and scheduled him to be see at thoracic clinic on 05/16/15 arrive at 2:45  Barriers/Navigation Needs Coordination of Care  Interventions Coordination of Care  Coordination of Care Appts  Acuity Level 1  Time Spent with Patient 15
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
## 2015-05-09 NOTE — Assessment & Plan Note (Addendum)
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  Oncology history developed.  Chart reviewed.  Patient met with Dr. [name] on 05/07/2015.  Patient refused surgical intervention for his RUL malignancy.  As a result, patient is worked into the clinic sooner than scheduled to determine the next best approach to his oncology care.  I reviewed the patient's options moving forward and attempted to have him reconsider the role of surgical intervention.  [name] was adamant about not undergoing surgery as he watched his mother pass quickly after her surgery.  Discussion nearly became argumentative on his part as I was reviewing the risks and benefits of treatment options.  He is educated that without surgery, he is not curable.  We discussed other options including XRT alone, XRT + chemotherapy, versus Hospice.  We discussed the incurability of his disease without surgical intervention.  We discussed the role of treatment without surgery would be to prolong life.  When I mentioned Hospice, his attitude changed.  He clearly wants to spend as little time as possible in any clinic.  He is willing to pursue radiation, because "it is quick."  He is unsure about chemotherapy given the length of treatment.  At the end of our visit, he is agreeable to consider chemotherapy.    I have messaged Dr. [name] who is scheduled to see the patient on 2/2.  I wonder if the mass is too large for SBRT.  I have messaged Dr. [name] regarding his plan from a radiation oncology standpoint.  I have asked if he is interested in concomitant chemotherapy.  I will wait and see what [name] recommendation is and I hope to communicate with him in the next few days.  I discussed the role of port-a-cath insertion for chemotherapy.  I reviewed the procedure to place one.  He is  against any surgery so I suspect IR would be the best place for him to get his port placed.  I showed him pictures of a port-a-cath and educated him regarding the role of this device.  Return as scheduled on 05/20/2015 to see [name].

## 2015-05-10 ENCOUNTER — Encounter (HOSPITAL_COMMUNITY): Payer: Self-pay | Admitting: Oncology

## 2015-05-10 ENCOUNTER — Encounter (HOSPITAL_BASED_OUTPATIENT_CLINIC_OR_DEPARTMENT_OTHER): Payer: Medicare Other | Admitting: Oncology

## 2015-05-10 VITALS — BP 158/79 | HR 83 | Temp 97.6°F | Resp 20

## 2015-05-10 DIAGNOSIS — C3411 Malignant neoplasm of upper lobe, right bronchus or lung: Secondary | ICD-10-CM

## 2015-05-10 DIAGNOSIS — C3491 Malignant neoplasm of unspecified part of right bronchus or lung: Secondary | ICD-10-CM

## 2015-05-10 NOTE — Patient Instructions (Signed)
[place] at [facility] Discharge Instructions  RECOMMENDATIONS MADE BY THE CONSULTANT AND ANY TEST RESULTS WILL BE SENT TO YOUR REFERRING PHYSICIAN.  Exam and discussion with Kirby Crigler, PA. Follow up with Dr. [name] as scheduled. Office visit with Dr. Whitney Muse as scheduled (05/20/15).    Thank you for choosing [place] at [facility] to provide your oncology and hematology care.  To afford each patient quality time with our provider, please arrive at least 15 minutes before your scheduled appointment time.   Beginning January 23rd 2017 lab work for the [facility] will be done in the  Main lab at [facility] on 1st floor. If you have a lab appointment with the [place] please come in thru the  Main Entrance and check in at the main information desk  You need to re-schedule your appointment should you arrive 10 or more minutes late.  We strive to give you quality time with our providers, and arriving late affects you and other patients whose appointments are after yours.  Also, if you no show three or more times for appointments you may be dismissed from the clinic at the providers discretion.     Again, thank you for choosing [facility].  Our hope is that these requests will decrease the amount of time that you wait before being seen by our physicians.       _____________________________________________________________  Should you have questions after your visit to [facility], please contact our office at [phone] between the hours of 8:30 a.m. and 4:30 p.m.  Voicemails left after 4:30 p.m. will not be returned until the following business day.  For prescription refill requests, have your pharmacy contact our office.

## 2015-05-12 ENCOUNTER — Other Ambulatory Visit: Payer: Self-pay

## 2015-05-12 ENCOUNTER — Inpatient Hospital Stay (HOSPITAL_COMMUNITY)
Admission: EM | Admit: 2015-05-12 | Discharge: 2015-05-14 | DRG: 100 | Disposition: A | Discharge status: Home / Self Care | Payer: Medicare Other | Attending: Internal Medicine | Admitting: Internal Medicine

## 2015-05-12 ENCOUNTER — Encounter (HOSPITAL_COMMUNITY): Payer: Self-pay

## 2015-05-12 ENCOUNTER — Emergency Department (HOSPITAL_COMMUNITY): Payer: Medicare Other

## 2015-05-12 DIAGNOSIS — E785 Hyperlipidemia, unspecified: Secondary | ICD-10-CM | POA: Diagnosis present

## 2015-05-12 DIAGNOSIS — E7800 Pure hypercholesterolemia, unspecified: Secondary | ICD-10-CM | POA: Diagnosis present

## 2015-05-12 DIAGNOSIS — C3491 Malignant neoplasm of unspecified part of right bronchus or lung: Secondary | ICD-10-CM

## 2015-05-12 DIAGNOSIS — F17210 Nicotine dependence, cigarettes, uncomplicated: Secondary | ICD-10-CM | POA: Diagnosis present

## 2015-05-12 DIAGNOSIS — E118 Type 2 diabetes mellitus with unspecified complications: Secondary | ICD-10-CM

## 2015-05-12 DIAGNOSIS — E871 Hypo-osmolality and hyponatremia: Secondary | ICD-10-CM | POA: Diagnosis present

## 2015-05-12 DIAGNOSIS — C3490 Malignant neoplasm of unspecified part of unspecified bronchus or lung: Secondary | ICD-10-CM | POA: Diagnosis present

## 2015-05-12 DIAGNOSIS — I10 Essential (primary) hypertension: Secondary | ICD-10-CM | POA: Diagnosis present

## 2015-05-12 DIAGNOSIS — Z720 Tobacco use: Secondary | ICD-10-CM | POA: Diagnosis present

## 2015-05-12 DIAGNOSIS — C7931 Secondary malignant neoplasm of brain: Secondary | ICD-10-CM | POA: Diagnosis present

## 2015-05-12 DIAGNOSIS — Z809 Family history of malignant neoplasm, unspecified: Secondary | ICD-10-CM

## 2015-05-12 DIAGNOSIS — Z993 Dependence on wheelchair: Secondary | ICD-10-CM | POA: Diagnosis not present

## 2015-05-12 DIAGNOSIS — G40901 Epilepsy, unspecified, not intractable, with status epilepticus: Principal | ICD-10-CM | POA: Diagnosis present

## 2015-05-12 DIAGNOSIS — E119 Type 2 diabetes mellitus without complications: Secondary | ICD-10-CM | POA: Diagnosis present

## 2015-05-12 DIAGNOSIS — R569 Unspecified convulsions: Secondary | ICD-10-CM

## 2015-05-12 DIAGNOSIS — G936 Cerebral edema: Secondary | ICD-10-CM | POA: Diagnosis present

## 2015-05-12 DIAGNOSIS — G8190 Hemiplegia, unspecified affecting unspecified side: Secondary | ICD-10-CM

## 2015-05-12 DIAGNOSIS — G8191 Hemiplegia, unspecified affecting right dominant side: Secondary | ICD-10-CM | POA: Diagnosis present

## 2015-05-12 LAB — URINALYSIS, ROUTINE W REFLEX MICROSCOPIC
BILIRUBIN URINE: NEGATIVE
Glucose, UA: NEGATIVE mg/dL
HGB URINE DIPSTICK: NEGATIVE
KETONES UR: NEGATIVE mg/dL
LEUKOCYTES UA: NEGATIVE
NITRITE: NEGATIVE
PH: 5.5 (ref 5.0–8.0)
PROTEIN: NEGATIVE mg/dL
SPECIFIC GRAVITY, URINE: 1.015 (ref 1.005–1.030)

## 2015-05-12 LAB — CBG MONITORING, ED: Glucose-Capillary: 148 mg/dL — ABNORMAL HIGH (ref 65–99)

## 2015-05-12 LAB — CBC WITH DIFFERENTIAL/PLATELET
BASOS PCT: 0 %
Basophils Absolute: 0 10*3/uL (ref 0.0–0.1)
EOS ABS: 0 10*3/uL (ref 0.0–0.7)
Eosinophils Relative: 0 %
HCT: 44 % (ref 39.0–52.0)
HEMOGLOBIN: 15.1 g/dL (ref 13.0–17.0)
LYMPHS ABS: 1 10*3/uL (ref 0.7–4.0)
Lymphocytes Relative: 9 %
MCH: 30 pg (ref 26.0–34.0)
MCHC: 34.3 g/dL (ref 30.0–36.0)
MCV: 87.3 fL (ref 78.0–100.0)
MONO ABS: 0.5 10*3/uL (ref 0.1–1.0)
MONOS PCT: 5 %
NEUTROS PCT: 86 %
Neutro Abs: 10.1 10*3/uL — ABNORMAL HIGH (ref 1.7–7.7)
Platelets: 181 10*3/uL (ref 150–400)
RBC: 5.04 MIL/uL (ref 4.22–5.81)
RDW: 14.3 % (ref 11.5–15.5)
WBC: 11.6 10*3/uL — ABNORMAL HIGH (ref 4.0–10.5)

## 2015-05-12 LAB — COMPREHENSIVE METABOLIC PANEL
ALK PHOS: 112 U/L (ref 38–126)
ALT: 14 U/L — ABNORMAL LOW (ref 17–63)
ANION GAP: 10 (ref 5–15)
AST: 19 U/L (ref 15–41)
Albumin: 3.6 g/dL (ref 3.5–5.0)
BILIRUBIN TOTAL: 0.5 mg/dL (ref 0.3–1.2)
BUN: 16 mg/dL (ref 6–20)
CALCIUM: 9 mg/dL (ref 8.9–10.3)
CO2: 24 mmol/L (ref 22–32)
Chloride: 100 mmol/L — ABNORMAL LOW (ref 101–111)
Creatinine, Ser: 0.99 mg/dL (ref 0.61–1.24)
GFR calc non Af Amer: >60 mL/min (ref >60)
Glucose, Bld: 155 mg/dL — ABNORMAL HIGH (ref 65–99)
Potassium: 4.1 mmol/L (ref 3.5–5.1)
SODIUM: 134 mmol/L — AB (ref 135–145)
Total Protein: 7.6 g/dL (ref 6.5–8.1)

## 2015-05-12 LAB — CK: CK TOTAL: 264 U/L (ref 49–397)

## 2015-05-12 LAB — GLUCOSE, CAPILLARY: GLUCOSE-CAPILLARY: 107 mg/dL — AB (ref 65–99)

## 2015-05-12 LAB — PHENOBARBITAL LEVEL: Phenobarbital: 20.8 ug/mL (ref 15.0–40.0)

## 2015-05-12 MED ORDER — ACETAMINOPHEN 325 MG PO TABS: 650.0000 mg | ORAL_TABLET | Freq: Four times a day (QID) | ORAL | Status: DC | PRN

## 2015-05-12 MED ORDER — INSULIN ASPART 100 UNIT/ML ~~LOC~~ SOLN
0.0000 [IU] | SUBCUTANEOUS | Status: DC
  Administered 2015-05-13 (×3): 3 [IU] via SUBCUTANEOUS

## 2015-05-12 MED ORDER — ONDANSETRON HCL 4 MG/2ML IJ SOLN: 4.0000 mg | Freq: Four times a day (QID) | INTRAMUSCULAR | Status: DC | PRN

## 2015-05-12 MED ORDER — ENOXAPARIN SODIUM 40 MG/0.4ML ~~LOC~~ SOLN
40.0000 mg | SUBCUTANEOUS | Status: DC
  Administered 2015-05-12: 40 mg via SUBCUTANEOUS
  Filled 2015-05-12 (×3): qty 0.4

## 2015-05-12 MED ORDER — PANCRELIPASE (LIP-PROT-AMYL) 36000-114000 UNITS PO CPEP: 36000.0000 [IU] | ORAL_CAPSULE | Freq: Two times a day (BID) | ORAL | Status: DC | PRN

## 2015-05-12 MED ORDER — LORATADINE 10 MG PO TABS
10.0000 mg | ORAL_TABLET | Freq: Every day | ORAL | Status: DC
  Administered 2015-05-12 – 2015-05-14 (×3): 10 mg via ORAL
  Filled 2015-05-12 (×3): qty 1

## 2015-05-12 MED ORDER — PHENOBARBITAL 97.2 MG PO TABS
97.2000 mg | ORAL_TABLET | Freq: Two times a day (BID) | ORAL | Status: DC
  Administered 2015-05-12 – 2015-05-14 (×4): 97.2 mg via ORAL
  Filled 2015-05-12 (×4): qty 1

## 2015-05-12 MED ORDER — PANCRELIPASE (LIP-PROT-AMYL) 36000-114000 UNITS PO CPEP
108000.0000 [IU] | ORAL_CAPSULE | Freq: Three times a day (TID) | ORAL | Status: DC
  Administered 2015-05-13 – 2015-05-14 (×4): 108000 [IU] via ORAL
  Filled 2015-05-12 (×5): qty 3

## 2015-05-12 MED ORDER — SODIUM CHLORIDE 0.9% FLUSH
3.0000 mL | Freq: Two times a day (BID) | INTRAVENOUS | Status: DC
  Administered 2015-05-14: 3 mL via INTRAVENOUS

## 2015-05-12 MED ORDER — SODIUM CHLORIDE 0.9 % IV SOLN: 75.0000 mL/h | INTRAVENOUS | Status: DC

## 2015-05-12 MED ORDER — LEVETIRACETAM 500 MG PO TABS
1000.0000 mg | ORAL_TABLET | Freq: Two times a day (BID) | ORAL | Status: DC
  Administered 2015-05-12 – 2015-05-13 (×3): 1000 mg via ORAL
  Filled 2015-05-12 (×3): qty 2

## 2015-05-12 MED ORDER — DEXAMETHASONE SODIUM PHOSPHATE 4 MG/ML IJ SOLN
4.0000 mg | Freq: Four times a day (QID) | INTRAMUSCULAR | Status: DC
  Administered 2015-05-13 (×4): 4 mg via INTRAVENOUS
  Filled 2015-05-12 (×4): qty 1

## 2015-05-12 MED ORDER — FUROSEMIDE 20 MG PO TABS
20.0000 mg | ORAL_TABLET | Freq: Every day | ORAL | Status: DC
  Administered 2015-05-12 – 2015-05-14 (×3): 20 mg via ORAL
  Filled 2015-05-12 (×3): qty 1

## 2015-05-12 MED ORDER — SODIUM CHLORIDE 0.9 % IV SOLN
INTRAVENOUS | Status: DC
Start: 2015-05-12 — End: 2015-05-14
  Administered 2015-05-12: 21:00:00 via INTRAVENOUS
  Administered 2015-05-13: 1 mL via INTRAVENOUS
  Administered 2015-05-13 (×2): via INTRAVENOUS

## 2015-05-12 MED ORDER — LEVETIRACETAM IN NACL 1000 MG/100ML IV SOLN
1000.0000 mg | Freq: Once | INTRAVENOUS | Status: AC
  Administered 2015-05-12: 1000 mg via INTRAVENOUS
  Filled 2015-05-12: qty 100

## 2015-05-12 MED ORDER — POTASSIUM CHLORIDE ER 10 MEQ PO TBCR
10.0000 meq | EXTENDED_RELEASE_TABLET | Freq: Every day | ORAL | Status: DC
Start: 2015-05-12 — End: 2015-05-14
  Administered 2015-05-12 – 2015-05-14 (×3): 10 meq via ORAL
  Filled 2015-05-12 (×7): qty 1

## 2015-05-12 MED ORDER — VITAMIN D 1000 UNITS PO TABS
1000.0000 [IU] | ORAL_TABLET | Freq: Every day | ORAL | Status: DC
  Administered 2015-05-12 – 2015-05-14 (×3): 1000 [IU] via ORAL
  Filled 2015-05-12 (×4): qty 1

## 2015-05-12 MED ORDER — CLONIDINE HCL 0.1 MG PO TABS
0.1000 mg | ORAL_TABLET | Freq: Two times a day (BID) | ORAL | Status: DC
  Administered 2015-05-12 – 2015-05-14 (×4): 0.1 mg via ORAL
  Filled 2015-05-12 (×4): qty 1

## 2015-05-12 MED ORDER — ONDANSETRON HCL 4 MG PO TABS: 4.0000 mg | ORAL_TABLET | Freq: Four times a day (QID) | ORAL | Status: DC | PRN

## 2015-05-12 MED ORDER — MORPHINE SULFATE (PF) 2 MG/ML IV SOLN
2.0000 mg | INTRAVENOUS | Status: DC | PRN
  Administered 2015-05-13: 2 mg via INTRAVENOUS
  Filled 2015-05-12: qty 1

## 2015-05-12 MED ORDER — LOSARTAN POTASSIUM 50 MG PO TABS
100.0000 mg | ORAL_TABLET | Freq: Every morning | ORAL | Status: DC
  Administered 2015-05-12 – 2015-05-14 (×3): 100 mg via ORAL
  Filled 2015-05-12 (×3): qty 2

## 2015-05-12 MED ORDER — METOPROLOL SUCCINATE ER 50 MG PO TB24
50.0000 mg | ORAL_TABLET | Freq: Every morning | ORAL | Status: DC
  Administered 2015-05-13 – 2015-05-14 (×2): 50 mg via ORAL
  Filled 2015-05-12 (×3): qty 1

## 2015-05-12 MED ORDER — PANTOPRAZOLE SODIUM 40 MG PO TBEC
40.0000 mg | DELAYED_RELEASE_TABLET | Freq: Every day | ORAL | Status: DC
  Administered 2015-05-12 – 2015-05-14 (×3): 40 mg via ORAL
  Filled 2015-05-12 (×3): qty 1

## 2015-05-12 MED ORDER — LORAZEPAM 2 MG/ML IJ SOLN: 1.0000 mg | INTRAMUSCULAR | Status: DC | PRN

## 2015-05-12 MED ORDER — DEXAMETHASONE SODIUM PHOSPHATE 4 MG/ML IJ SOLN
10.0000 mg | INTRAMUSCULAR | Status: AC
  Administered 2015-05-12: 10 mg via INTRAVENOUS
  Filled 2015-05-12: qty 3
  Filled 2015-05-12: qty 1

## 2015-05-12 MED ORDER — PRAVASTATIN SODIUM 10 MG PO TABS
20.0000 mg | ORAL_TABLET | Freq: Every day | ORAL | Status: DC
  Administered 2015-05-12 – 2015-05-13 (×2): 20 mg via ORAL
  Filled 2015-05-12 (×2): qty 2

## 2015-05-12 NOTE — ED Notes (Signed)
Patient reports of approx. 10 seizures today. States he has arua "my head starts turning." Patient loss control of bladder. Per EMS patient was postictal on arrival. Patient alert and oriented x4. NAD noted. Denies injury.

## 2015-05-12 NOTE — ED Notes (Signed)
MD at bedside. 

## 2015-05-12 NOTE — ED Notes (Signed)
CBG 180 per EMS.
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
## 2015-05-13 LAB — GLUCOSE, CAPILLARY
GLUCOSE-CAPILLARY: 154 mg/dL — AB (ref 65–99)
GLUCOSE-CAPILLARY: 106 mg/dL — AB (ref 65–99)
GLUCOSE-CAPILLARY: 159 mg/dL — AB (ref 65–99)
Glucose-Capillary: 143 mg/dL — ABNORMAL HIGH (ref 65–99)
Glucose-Capillary: 180 mg/dL — ABNORMAL HIGH (ref 65–99)
Glucose-Capillary: 189 mg/dL — ABNORMAL HIGH (ref 65–99)

## 2015-05-13 LAB — MRSA PCR SCREENING: MRSA by PCR: NEGATIVE

## 2015-05-13 LAB — BASIC METABOLIC PANEL
ANION GAP: 9 (ref 5–15)
BUN: 12 mg/dL (ref 6–20)
CO2: 23 mmol/L (ref 22–32)
Calcium: 8.5 mg/dL — ABNORMAL LOW (ref 8.9–10.3)
Chloride: 101 mmol/L (ref 101–111)
Creatinine, Ser: 0.85 mg/dL (ref 0.61–1.24)
GFR calc Af Amer: >60 mL/min (ref >60)
GFR calc non Af Amer: >60 mL/min (ref >60)
GLUCOSE: 149 mg/dL — AB (ref 65–99)
POTASSIUM: 4.2 mmol/L (ref 3.5–5.1)
Sodium: 133 mmol/L — ABNORMAL LOW (ref 135–145)

## 2015-05-13 LAB — CBC
HEMATOCRIT: 46.7 % (ref 39.0–52.0)
HEMOGLOBIN: 15.6 g/dL (ref 13.0–17.0)
MCH: 29.5 pg (ref 26.0–34.0)
MCHC: 33.4 g/dL (ref 30.0–36.0)
MCV: 88.4 fL (ref 78.0–100.0)
Platelets: 192 10*3/uL (ref 150–400)
RBC: 5.28 MIL/uL (ref 4.22–5.81)
RDW: 14.5 % (ref 11.5–15.5)
WBC: 9.4 10*3/uL (ref 4.0–10.5)

## 2015-05-13 LAB — RAPID URINE DRUG SCREEN, HOSP PERFORMED
Amphetamines: NOT DETECTED
BARBITURATES: POSITIVE — AB
Benzodiazepines: NOT DETECTED
Cocaine: NOT DETECTED
Opiates: NOT DETECTED
TETRAHYDROCANNABINOL: POSITIVE — AB

## 2015-05-13 LAB — PHENOBARBITAL LEVEL: Phenobarbital: 17.6 ug/mL (ref 15.0–40.0)

## 2015-05-13 MED ORDER — LEVETIRACETAM 500 MG PO TABS
500.0000 mg | ORAL_TABLET | Freq: Once | ORAL | Status: AC
  Administered 2015-05-13: 500 mg via ORAL
  Filled 2015-05-13: qty 1

## 2015-05-13 MED ORDER — LEVETIRACETAM 500 MG PO TABS
1500.0000 mg | ORAL_TABLET | Freq: Two times a day (BID) | ORAL | Status: DC
  Administered 2015-05-14: 1500 mg via ORAL
  Filled 2015-05-13: qty 3

## 2015-05-13 MED ORDER — CETYLPYRIDINIUM CHLORIDE 0.05 % MT LIQD
7.0000 mL | Freq: Two times a day (BID) | OROMUCOSAL | Status: DC
  Administered 2015-05-13 – 2015-05-14 (×4): 7 mL via OROMUCOSAL

## 2015-05-13 MED ORDER — INSULIN ASPART 100 UNIT/ML ~~LOC~~ SOLN
0.0000 [IU] | Freq: Three times a day (TID) | SUBCUTANEOUS | Status: DC
  Administered 2015-05-14: 5 [IU] via SUBCUTANEOUS

## 2015-05-13 NOTE — Care Management Note (Signed)
Case Management Note  Patient Details  Name: BORNA WESSINGER MRN: [id number] Date of Birth: [birth year]  Subjective/Objective:                  Admitted with seizures. Pt is from home, lives alone and has aid 7 days a week, 8 hrs a day. Pt has strong family support as well. Pt is wheelchair bound and has hospital bed, lift and WC prior to admission. Pt plans to return home with resumption of previous arrangement.   Action/Plan: No anticipated CM needs.   Expected Discharge Date:  05/14/15               Expected Discharge Plan:  Home/Self Care  In-House Referral:  NA  Discharge planning Services  CM Consult  Post Acute Care Choice:  NA Choice offered to:  NA  DME Arranged:    DME Agency:     HH Arranged:    HH Agency:     Status of Service:  In process, will continue to follow  Medicare Important Message Given:    Date Medicare IM Given:    Medicare IM give by:    Date Additional Medicare IM Given:    Additional Medicare Important Message give by:     If discussed at [place] of Stay Meetings, dates discussed:    Additional Comments:  Sherald Barge, RN 05/13/2015, 3:19 PM
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
## 2015-05-13 NOTE — Progress Notes (Signed)
Report received from [facility], ICU.  Patient arrived to Room 321 via bed.  Waiting telemtry to place on patient.  Seizure precautions placed.
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
## 2015-05-13 NOTE — Progress Notes (Signed)
Telemetry placed and verified.  Total care, total feed.  Requested from Dr Freddy Jaksch on floor that CBG's be changed from Q4hours to AC/HS.  Waiting order.

## 2015-05-14 ENCOUNTER — Inpatient Hospital Stay (HOSPITAL_COMMUNITY)
Admit: 2015-05-14 | Discharge: 2015-05-14 | Disposition: A | Discharge status: Home / Self Care | Payer: Medicare Other | Attending: Internal Medicine | Admitting: Internal Medicine

## 2015-05-14 ENCOUNTER — Ambulatory Visit (HOSPITAL_COMMUNITY): Payer: Medicare Other | Admitting: Hematology & Oncology

## 2015-05-14 LAB — GLUCOSE, CAPILLARY
Glucose-Capillary: 97 mg/dL (ref 65–99)
Glucose-Capillary: 203 mg/dL — ABNORMAL HIGH (ref 65–99)

## 2015-05-14 MED ORDER — LEVETIRACETAM 750 MG PO TABS: 1500.0000 mg | ORAL_TABLET | Freq: Two times a day (BID) | ORAL | Status: DC

## 2015-05-14 MED ORDER — DEXAMETHASONE 4 MG PO TABS: 4.0000 mg | ORAL_TABLET | Freq: Two times a day (BID) | ORAL | Status: DC
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
## 2015-05-14 NOTE — Progress Notes (Signed)
[name] discharged Home per MD order.  Discharge instructions reviewed and discussed with the patient, all questions and concerns answered. Copy of instructions and scripts given to patient.    Medication List    TAKE these medications        acetaminophen 500 MG tablet  Commonly known as:  TYLENOL  Take 1,000 mg by mouth every 6 (six) hours as needed.     cetirizine 10 MG tablet  Commonly known as:  ZYRTEC  Take 10 mg by mouth daily.     cholecalciferol 1000 units tablet  Commonly known as:  VITAMIN D  Take 1,000 Units by mouth daily.     cloNIDine 0.1 MG tablet  Commonly known as:  CATAPRES  Take 0.1 mg by mouth 2 (two) times daily.     dexamethasone 4 MG tablet  Commonly known as:  DECADRON  Take 1 tablet (4 mg total) by mouth 2 (two) times daily with a meal.     furosemide 20 MG tablet  Commonly known as:  LASIX  Take 20 mg by mouth daily.     INVOKANA 100 MG Tabs tablet  Generic drug:  canagliflozin  TAKE (1) TABLET BY MOUTH ONCE DAILY.     levETIRAcetam 750 MG tablet  Commonly known as:  KEPPRA  Take 2 tablets (1,500 mg total) by mouth 2 (two) times daily.     lipase/protease/amylase 36000 UNITS Cpep capsule  Commonly known as:  CREON  Take 3 capsules (108,000 Units total) by mouth 3 (three) times daily with meals. TAKE 1 WITH SNACKS (TWO SNACKS DAILY).     losartan 100 MG tablet  Commonly known as:  COZAAR  Take 100 mg by mouth every morning.     metFORMIN 1000 MG tablet  Commonly known as:  GLUCOPHAGE  TAKE 1 TABLET BY MOUTH TWICE DAILY.     metoprolol succinate 50 MG 24 hr tablet  Commonly known as:  TOPROL-XL  Take 50 mg by mouth every morning. Take with or immediately following a meal.     omeprazole 20 MG capsule  Commonly known as:  PRILOSEC  Take 20 mg by mouth daily.     PHENobarbital 97.2 MG tablet  Commonly known as:  LUMINAL  Take 97.2 mg by mouth 2 (two) times daily.     potassium chloride 10 MEQ tablet  Commonly known as:   K-DUR  Take 10 mEq by mouth daily.     pravastatin 20 MG tablet  Commonly known as:  PRAVACHOL  Take 20 mg by mouth at bedtime.     Vitamin D (Ergocalciferol) 50000 units Caps capsule  Commonly known as:  DRISDOL  TAKE 1 CAPSULE BY MOUTH ONCE A WEEK.        Patients skin is clean, dry and intact, no evidence of skin break down. IV site discontinued and catheter remains intact. Site without signs and symptoms of complications. Dressing and pressure applied.  Patient escorted to car by NT in a wheelchair,  no distress noted upon discharge.  [name]

## 2015-05-14 NOTE — Progress Notes (Signed)
EEG Completed; Results Pending  

## 2015-05-15 ENCOUNTER — Telehealth: Payer: Self-pay | Admitting: *Deleted

## 2015-05-15 NOTE — Procedures (Signed)
  Craig A. Merlene Laughter, MD     [email or web address]           HISTORY:  The patient is a 55 year old man who presents with status epilepticus.   MEDICATIONS: Scheduled Meds: Continuous Infusions: PRN Meds:.  Prior to Admission medications   Medication Sig Start Date End Date Taking? Authorizing Provider  acetaminophen (TYLENOL) 500 MG tablet Take 1,000 mg by mouth every 6 (six) hours as needed.    Historical Provider, MD  cetirizine (ZYRTEC) 10 MG tablet Take 10 mg by mouth daily.    Historical Provider, MD  cholecalciferol (VITAMIN D) 1000 UNITS tablet Take 1,000 Units by mouth daily.    Historical Provider, MD  cloNIDine (CATAPRES) 0.1 MG tablet Take 0.1 mg by mouth 2 (two) times daily.    Historical Provider, MD  dexamethasone (DECADRON) 4 MG tablet Take 1 tablet (4 mg total) by mouth 2 (two) times daily with a meal. 05/14/15   Kathie Dike, MD  furosemide (LASIX) 20 MG tablet Take 20 mg by mouth daily.    Historical Provider, MD  INVOKANA 100 MG TABS tablet TAKE (1) TABLET BY MOUTH ONCE DAILY. 04/23/15   Cassandria Anger, MD  levETIRAcetam (KEPPRA) 750 MG tablet Take 2 tablets (1,500 mg total) by mouth 2 (two) times daily. 05/14/15   Kathie Dike, MD  lipase/protease/amylase (CREON) 36000 UNITS CPEP capsule Take 3 capsules (108,000 Units total) by mouth 3 (three) times daily with meals. TAKE 1 WITH SNACKS (TWO SNACKS DAILY). 02/18/15   Orvil Feil, NP  losartan (COZAAR) 100 MG tablet Take 100 mg by mouth every morning.     Historical Provider, MD  metFORMIN (GLUCOPHAGE) 1000 MG tablet TAKE 1 TABLET BY MOUTH TWICE DAILY. 04/23/15   Cassandria Anger, MD  metoprolol succinate (TOPROL-XL) 50 MG 24 hr tablet Take 50 mg by mouth every morning. Take with or immediately following a meal.    Historical Provider, MD  omeprazole (PRILOSEC) 20 MG capsule Take 20 mg by mouth daily.    Historical Provider, MD  PHENobarbital (LUMINAL) 97.2 MG tablet Take 97.2 mg by  mouth 2 (two) times daily.      Historical Provider, MD  potassium chloride (K-DUR) 10 MEQ tablet Take 10 mEq by mouth daily.    Historical Provider, MD  pravastatin (PRAVACHOL) 20 MG tablet Take 20 mg by mouth at bedtime.    Historical Provider, MD  Vitamin D, Ergocalciferol, (DRISDOL) 50000 units CAPS capsule TAKE 1 CAPSULE BY MOUTH ONCE A WEEK. 04/23/15   Cassandria Anger, MD      ANALYSIS: A 16 channel recording using standard 10 20 measurements is conducted for 22 minutes. There is a well-formed posterior dominant rhythm of 9-1/2-10 Hz which attenuates with eye opening. There is beta activity observed in the frontal areas. Awake and drowsy activities are observed. Photic stimulation and hyperventilation were not carried out. There are no focal or lateral slowing. There is no epileptiform activity is observed.   IMPRESSION: This is a normal recording of the awake and drowsy states.      Amariyah Bazar A. Merlene [name], M.D.  Diplomate, Tax adviser of Psychiatry and Neurology ( Neurology).

## 2015-05-15 NOTE — Telephone Encounter (Signed)
Called and confirmed 05/16/15 clinic appt w/ pt.

## 2015-05-16 ENCOUNTER — Inpatient Hospital Stay (HOSPITAL_COMMUNITY)
Admission: EM | Admit: 2015-05-16 | Discharge: 2015-05-18 | DRG: 100 | Disposition: A | Discharge status: Home Health | Payer: Medicare Other | Attending: Internal Medicine | Admitting: Internal Medicine

## 2015-05-16 ENCOUNTER — Emergency Department (HOSPITAL_COMMUNITY): Payer: Medicare Other

## 2015-05-16 ENCOUNTER — Encounter (HOSPITAL_COMMUNITY): Payer: Self-pay | Admitting: Emergency Medicine

## 2015-05-16 ENCOUNTER — Ambulatory Visit: Payer: Medicare Other | Attending: Radiation Oncology | Admitting: Radiation Oncology

## 2015-05-16 ENCOUNTER — Emergency Department (HOSPITAL_COMMUNITY)
Admission: EM | Admit: 2015-05-16 | Discharge: 2015-05-16 | Disposition: A | Discharge status: Home / Self Care | Payer: Medicare Other | Source: Home / Self Care | Attending: Emergency Medicine | Admitting: Emergency Medicine

## 2015-05-16 ENCOUNTER — Encounter (HOSPITAL_COMMUNITY): Payer: Self-pay

## 2015-05-16 ENCOUNTER — Ambulatory Visit: Payer: Medicare Other | Admitting: Physical Therapy

## 2015-05-16 ENCOUNTER — Ambulatory Visit: Admission: RE | Admit: 2015-05-16 | Payer: Medicare Other | Source: Ambulatory Visit | Admitting: Radiation Oncology

## 2015-05-16 DIAGNOSIS — Z7952 Long term (current) use of systemic steroids: Secondary | ICD-10-CM

## 2015-05-16 DIAGNOSIS — Z9114 Patient's other noncompliance with medication regimen: Secondary | ICD-10-CM | POA: Diagnosis not present

## 2015-05-16 DIAGNOSIS — E872 Acidosis, unspecified: Secondary | ICD-10-CM | POA: Diagnosis present

## 2015-05-16 DIAGNOSIS — G9340 Encephalopathy, unspecified: Secondary | ICD-10-CM | POA: Diagnosis present

## 2015-05-16 DIAGNOSIS — Z7984 Long term (current) use of oral hypoglycemic drugs: Secondary | ICD-10-CM

## 2015-05-16 DIAGNOSIS — I10 Essential (primary) hypertension: Secondary | ICD-10-CM | POA: Diagnosis present

## 2015-05-16 DIAGNOSIS — E876 Hypokalemia: Secondary | ICD-10-CM | POA: Diagnosis present

## 2015-05-16 DIAGNOSIS — Z87828 Personal history of other (healed) physical injury and trauma: Secondary | ICD-10-CM | POA: Insufficient documentation

## 2015-05-16 DIAGNOSIS — R569 Unspecified convulsions: Secondary | ICD-10-CM

## 2015-05-16 DIAGNOSIS — E7800 Pure hypercholesterolemia, unspecified: Secondary | ICD-10-CM | POA: Insufficient documentation

## 2015-05-16 DIAGNOSIS — Z8719 Personal history of other diseases of the digestive system: Secondary | ICD-10-CM

## 2015-05-16 DIAGNOSIS — Z79899 Other long term (current) drug therapy: Secondary | ICD-10-CM | POA: Insufficient documentation

## 2015-05-16 DIAGNOSIS — Z91040 Latex allergy status: Secondary | ICD-10-CM | POA: Insufficient documentation

## 2015-05-16 DIAGNOSIS — Z809 Family history of malignant neoplasm, unspecified: Secondary | ICD-10-CM | POA: Diagnosis not present

## 2015-05-16 DIAGNOSIS — G936 Cerebral edema: Secondary | ICD-10-CM | POA: Diagnosis present

## 2015-05-16 DIAGNOSIS — N179 Acute kidney failure, unspecified: Secondary | ICD-10-CM | POA: Diagnosis present

## 2015-05-16 DIAGNOSIS — Z862 Personal history of diseases of the blood and blood-forming organs and certain disorders involving the immune mechanism: Secondary | ICD-10-CM

## 2015-05-16 DIAGNOSIS — G40401 Other generalized epilepsy and epileptic syndromes, not intractable, with status epilepticus: Secondary | ICD-10-CM | POA: Diagnosis not present

## 2015-05-16 DIAGNOSIS — G8191 Hemiplegia, unspecified affecting right dominant side: Secondary | ICD-10-CM | POA: Insufficient documentation

## 2015-05-16 DIAGNOSIS — N289 Disorder of kidney and ureter, unspecified: Secondary | ICD-10-CM | POA: Diagnosis not present

## 2015-05-16 DIAGNOSIS — C7931 Secondary malignant neoplasm of brain: Secondary | ICD-10-CM | POA: Diagnosis present

## 2015-05-16 DIAGNOSIS — F17210 Nicotine dependence, cigarettes, uncomplicated: Secondary | ICD-10-CM | POA: Diagnosis present

## 2015-05-16 DIAGNOSIS — C3490 Malignant neoplasm of unspecified part of unspecified bronchus or lung: Secondary | ICD-10-CM | POA: Insufficient documentation

## 2015-05-16 DIAGNOSIS — E119 Type 2 diabetes mellitus without complications: Secondary | ICD-10-CM | POA: Diagnosis present

## 2015-05-16 DIAGNOSIS — G40901 Epilepsy, unspecified, not intractable, with status epilepticus: Secondary | ICD-10-CM | POA: Diagnosis present

## 2015-05-16 DIAGNOSIS — C3491 Malignant neoplasm of unspecified part of right bronchus or lung: Secondary | ICD-10-CM | POA: Diagnosis present

## 2015-05-16 DIAGNOSIS — T17908A Unspecified foreign body in respiratory tract, part unspecified causing other injury, initial encounter: Secondary | ICD-10-CM

## 2015-05-16 HISTORY — DX: Malignant neoplasm of unspecified part of right bronchus or lung: C34.91

## 2015-05-16 LAB — CBC WITH DIFFERENTIAL/PLATELET
Basophils Absolute: 0 10*3/uL (ref 0.0–0.1)
Basophils Relative: 0 %
Eosinophils Absolute: 0.1 10*3/uL (ref 0.0–0.7)
Eosinophils Relative: 1 %
HCT: 45.1 % (ref 39.0–52.0)
Hemoglobin: 15.2 g/dL (ref 13.0–17.0)
Lymphocytes Relative: 22 %
Lymphs Abs: 2.5 10*3/uL (ref 0.7–4.0)
MCH: 29.8 pg (ref 26.0–34.0)
MCHC: 33.7 g/dL (ref 30.0–36.0)
MCV: 88.4 fL (ref 78.0–100.0)
Monocytes Absolute: 1.1 10*3/uL — ABNORMAL HIGH (ref 0.1–1.0)
Monocytes Relative: 9 %
Neutro Abs: 7.7 10*3/uL (ref 1.7–7.7)
Neutrophils Relative %: 68 %
Platelets: 203 10*3/uL (ref 150–400)
RBC: 5.1 MIL/uL (ref 4.22–5.81)
RDW: 14.3 % (ref 11.5–15.5)
WBC: 11.5 10*3/uL — ABNORMAL HIGH (ref 4.0–10.5)

## 2015-05-16 LAB — I-STAT CHEM 8, ED
BUN: 19 mg/dL (ref 6–20)
CREATININE: 1.2 mg/dL (ref 0.61–1.24)
Calcium, Ion: 1.07 mmol/L — ABNORMAL LOW (ref 1.12–1.23)
Chloride: 102 mmol/L (ref 101–111)
Glucose, Bld: 147 mg/dL — ABNORMAL HIGH (ref 65–99)
HEMATOCRIT: 48 % (ref 39.0–52.0)
Hemoglobin: 16.3 g/dL (ref 13.0–17.0)
Potassium: 3.4 mmol/L — ABNORMAL LOW (ref 3.5–5.1)
Sodium: 137 mmol/L (ref 135–145)
TCO2: 14 mmol/L (ref 0–100)

## 2015-05-16 LAB — I-STAT CG4 LACTIC ACID, ED: Lactic Acid, Venous: 11.34 mmol/L (ref 0.5–2.0)

## 2015-05-16 LAB — URINALYSIS, ROUTINE W REFLEX MICROSCOPIC
Bilirubin Urine: NEGATIVE
GLUCOSE, UA: NEGATIVE mg/dL
Hgb urine dipstick: NEGATIVE
KETONES UR: NEGATIVE mg/dL
LEUKOCYTES UA: NEGATIVE
Nitrite: NEGATIVE
PROTEIN: NEGATIVE mg/dL
Specific Gravity, Urine: 1.015 (ref 1.005–1.030)
pH: 5 (ref 5.0–8.0)

## 2015-05-16 LAB — COMPREHENSIVE METABOLIC PANEL
ALBUMIN: 3.4 g/dL — AB (ref 3.5–5.0)
ALK PHOS: 112 U/L (ref 38–126)
ALK PHOS: 119 U/L (ref 38–126)
ALT: 21 U/L (ref 17–63)
ALT: 26 U/L (ref 17–63)
ANION GAP: 12 (ref 5–15)
ANION GAP: 20 — AB (ref 5–15)
AST: 22 U/L (ref 15–41)
AST: 35 U/L (ref 15–41)
Albumin: 3.5 g/dL (ref 3.5–5.0)
BILIRUBIN TOTAL: 0.6 mg/dL (ref 0.3–1.2)
BUN: 27 mg/dL — ABNORMAL HIGH (ref 6–20)
BUN: 21 mg/dL — ABNORMAL HIGH (ref 6–20)
CALCIUM: 8.7 mg/dL — AB (ref 8.9–10.3)
CALCIUM: 8.6 mg/dL — AB (ref 8.9–10.3)
CO2: 25 mmol/L (ref 22–32)
CO2: 15 mmol/L — ABNORMAL LOW (ref 22–32)
CREATININE: 1.8 mg/dL — AB (ref 0.61–1.24)
Chloride: 98 mmol/L — ABNORMAL LOW (ref 101–111)
Chloride: 101 mmol/L (ref 101–111)
Creatinine, Ser: 1.33 mg/dL — ABNORMAL HIGH (ref 0.61–1.24)
GFR calc Af Amer: 47 mL/min — ABNORMAL LOW (ref >60)
GFR calc non Af Amer: 40 mL/min — ABNORMAL LOW (ref >60)
GFR, EST NON AFRICAN AMERICAN: 58 mL/min — AB (ref >60)
GLUCOSE: 128 mg/dL — AB (ref 65–99)
GLUCOSE: 148 mg/dL — AB (ref 65–99)
POTASSIUM: 3.3 mmol/L — AB (ref 3.5–5.1)
Potassium: 4 mmol/L (ref 3.5–5.1)
SODIUM: 135 mmol/L (ref 135–145)
Sodium: 136 mmol/L (ref 135–145)
TOTAL PROTEIN: 7.5 g/dL (ref 6.5–8.1)
Total Bilirubin: 0.5 mg/dL (ref 0.3–1.2)
Total Protein: 7.2 g/dL (ref 6.5–8.1)

## 2015-05-16 LAB — GLUCOSE, CAPILLARY
Glucose-Capillary: 156 mg/dL — ABNORMAL HIGH (ref 65–99)
Glucose-Capillary: 196 mg/dL — ABNORMAL HIGH (ref 65–99)

## 2015-05-16 LAB — PHENOBARBITAL LEVEL: Phenobarbital: 15.4 ug/mL (ref 15.0–40.0)

## 2015-05-16 LAB — CBG MONITORING, ED: Glucose-Capillary: 136 mg/dL — ABNORMAL HIGH (ref 65–99)

## 2015-05-16 MED ORDER — SODIUM BICARBONATE 8.4 % IV SOLN
INTRAVENOUS | Status: DC
  Filled 2015-05-16 (×4): qty 1000

## 2015-05-16 MED ORDER — SODIUM CHLORIDE 0.9 % IV SOLN
1000.0000 mL | Freq: Once | INTRAVENOUS | Status: AC
  Administered 2015-05-16: 1000 mL via INTRAVENOUS

## 2015-05-16 MED ORDER — DIAZEPAM 5 MG/ML IJ SOLN
10.0000 mg | Freq: Once | INTRAMUSCULAR | Status: AC
  Administered 2015-05-16: 10 mg via INTRAMUSCULAR

## 2015-05-16 MED ORDER — ENOXAPARIN SODIUM 40 MG/0.4ML ~~LOC~~ SOLN: 40.0000 mg | SUBCUTANEOUS | Status: DC

## 2015-05-16 MED ORDER — NICOTINE 21 MG/24HR TD PT24
21.0000 mg | MEDICATED_PATCH | Freq: Every day | TRANSDERMAL | Status: DC
  Administered 2015-05-16 – 2015-05-18 (×3): 21 mg via TRANSDERMAL
  Filled 2015-05-16 (×3): qty 1

## 2015-05-16 MED ORDER — SODIUM CHLORIDE 0.9 % IV BOLUS (SEPSIS)
1000.0000 mL | Freq: Once | INTRAVENOUS | Status: AC
  Administered 2015-05-16: 1000 mL via INTRAVENOUS

## 2015-05-16 MED ORDER — DEXAMETHASONE SODIUM PHOSPHATE 4 MG/ML IJ SOLN
4.0000 mg | Freq: Two times a day (BID) | INTRAMUSCULAR | Status: DC
  Administered 2015-05-16 – 2015-05-17 (×3): 4 mg via INTRAVENOUS
  Filled 2015-05-16 (×3): qty 1

## 2015-05-16 MED ORDER — SODIUM CHLORIDE 0.9% FLUSH
3.0000 mL | Freq: Two times a day (BID) | INTRAVENOUS | Status: DC
  Administered 2015-05-16 – 2015-05-17 (×3): 3 mL via INTRAVENOUS

## 2015-05-16 MED ORDER — DIAZEPAM 5 MG/ML IJ SOLN
INTRAMUSCULAR | Status: AC
  Filled 2015-05-16: qty 2

## 2015-05-16 MED ORDER — SODIUM CHLORIDE 0.9 % IV SOLN
1500.0000 mg | Freq: Once | INTRAVENOUS | Status: DC
  Filled 2015-05-16: qty 15

## 2015-05-16 MED ORDER — ENOXAPARIN SODIUM 40 MG/0.4ML ~~LOC~~ SOLN
40.0000 mg | SUBCUTANEOUS | Status: DC
  Administered 2015-05-16 – 2015-05-18 (×3): 40 mg via SUBCUTANEOUS
  Filled 2015-05-16 (×3): qty 0.4

## 2015-05-16 MED ORDER — VANCOMYCIN HCL IN DEXTROSE 1-5 GM/200ML-% IV SOLN
1000.0000 mg | Freq: Once | INTRAVENOUS | Status: AC
  Administered 2015-05-16: 1000 mg via INTRAVENOUS
  Filled 2015-05-16: qty 200

## 2015-05-16 MED ORDER — LORAZEPAM 2 MG/ML IJ SOLN: 1.0000 mg | Freq: Once | INTRAMUSCULAR | Status: DC | PRN

## 2015-05-16 MED ORDER — LEVOFLOXACIN IN D5W 500 MG/100ML IV SOLN
500.0000 mg | Freq: Once | INTRAVENOUS | Status: AC
  Administered 2015-05-16: 500 mg via INTRAVENOUS
  Filled 2015-05-16: qty 100

## 2015-05-16 MED ORDER — INSULIN ASPART 100 UNIT/ML ~~LOC~~ SOLN: 0.0000 [IU] | Freq: Every day | SUBCUTANEOUS | Status: DC

## 2015-05-16 MED ORDER — LORAZEPAM 2 MG/ML IJ SOLN
1.0000 mg | Freq: Once | INTRAMUSCULAR | Status: AC
  Administered 2015-05-16: 1 mg via INTRAVENOUS
  Filled 2015-05-16: qty 1

## 2015-05-16 MED ORDER — INSULIN ASPART 100 UNIT/ML ~~LOC~~ SOLN
0.0000 [IU] | Freq: Three times a day (TID) | SUBCUTANEOUS | Status: DC
  Administered 2015-05-16 – 2015-05-17 (×2): 2 [IU] via SUBCUTANEOUS
  Administered 2015-05-17: 3 [IU] via SUBCUTANEOUS
  Administered 2015-05-18: 1 [IU] via SUBCUTANEOUS

## 2015-05-16 MED ORDER — LABETALOL HCL 5 MG/ML IV SOLN
5.0000 mg | INTRAVENOUS | Status: DC
  Administered 2015-05-16 – 2015-05-17 (×6): 5 mg via INTRAVENOUS
  Filled 2015-05-16 (×5): qty 4

## 2015-05-16 MED ORDER — SODIUM CHLORIDE 0.45 % IV SOLN
INTRAVENOUS | Status: DC
  Administered 2015-05-16 – 2015-05-17 (×2): via INTRAVENOUS
  Filled 2015-05-16 (×13): qty 1000

## 2015-05-16 MED ORDER — HYDRALAZINE HCL 20 MG/ML IJ SOLN: 5.0000 mg | INTRAMUSCULAR | Status: DC | PRN

## 2015-05-16 MED ORDER — SODIUM CHLORIDE 0.9 % IV BOLUS (SEPSIS)
500.0000 mL | Freq: Once | INTRAVENOUS | Status: AC
  Administered 2015-05-16: 500 mL via INTRAVENOUS

## 2015-05-16 MED ORDER — PHENOBARBITAL SODIUM 130 MG/ML IJ SOLN
100.0000 mg | Freq: Two times a day (BID) | INTRAMUSCULAR | Status: DC
Start: 2015-05-16 — End: 2015-05-17
  Administered 2015-05-16 – 2015-05-17 (×2): 100 mg via INTRAVENOUS
  Filled 2015-05-16 (×2): qty 1

## 2015-05-16 MED ORDER — PHENOBARBITAL SODIUM 130 MG/ML IJ SOLN
100.0000 mg | Freq: Once | INTRAMUSCULAR | Status: AC
  Administered 2015-05-16: 100 mg via INTRAVENOUS
  Filled 2015-05-16: qty 1

## 2015-05-16 MED ORDER — LEVETIRACETAM IN NACL 1500 MG/100ML IV SOLN
1500.0000 mg | Freq: Two times a day (BID) | INTRAVENOUS | Status: DC
  Administered 2015-05-17: 1500 mg via INTRAVENOUS
  Filled 2015-05-16 (×7): qty 100

## 2015-05-16 MED ORDER — LEVETIRACETAM IN NACL 1500 MG/100ML IV SOLN
1500.0000 mg | Freq: Once | INTRAVENOUS | Status: AC
  Administered 2015-05-16: 1500 mg via INTRAVENOUS
  Filled 2015-05-16: qty 100

## 2015-05-16 MED ORDER — LEVETIRACETAM IN NACL 1000 MG/100ML IV SOLN
1000.0000 mg | Freq: Once | INTRAVENOUS | Status: AC
  Administered 2015-05-16: 1000 mg via INTRAVENOUS
  Filled 2015-05-16: qty 100
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
## 2015-05-16 NOTE — Discharge Instructions (Signed)
Please fill the prescription for Keppra in the morning. Call your doctors for follow up.  Seizure, Adult A seizure is abnormal electrical activity in the brain. Seizures usually last from 30 seconds to 2 minutes. There are various types of seizures. Before a seizure, you may have a warning sensation (aura) that a seizure is about to occur. An aura may include the following symptoms:   Fear or anxiety.  Nausea.  Feeling like the room is spinning (vertigo).  Vision changes, such as seeing flashing lights or spots. Common symptoms during a seizure include:  A change in attention or behavior (altered mental status).  Convulsions with rhythmic jerking movements.  Drooling.  Rapid eye movements.  Grunting.  Loss of bladder and bowel control.  Bitter taste in the mouth.  Tongue biting. After a seizure, you may feel confused and sleepy. You may also have an injury resulting from convulsions during the seizure. HOME CARE INSTRUCTIONS   If you are given medicines, take them exactly as prescribed by your health care provider.  Keep all follow-up appointments as directed by your health care provider.  Do not swim or drive or engage in risky activity during which a seizure could cause further injury to you or others until your health care provider says it is OK.  Get adequate rest.  Teach friends and family what to do if you have a seizure. They should:  Lay you on the ground to prevent a fall.  Put a cushion under your head.  Loosen any tight clothing around your neck.  Turn you on your side. If vomiting occurs, this helps keep your airway clear.  Stay with you until you recover.  Know whether or not you need emergency care. SEEK IMMEDIATE MEDICAL CARE IF:  The seizure lasts longer than 5 minutes.  The seizure is severe or you do not wake up immediately after the seizure.  You have an altered mental status after the seizure.  You are having more frequent or worsening  seizures. Someone should drive you to the emergency department or call local emergency services (911 in [place] MAKE SURE YOU:  Understand these instructions.  Will watch your condition.  Will get help right away if you are not doing well or get worse.   This information is not intended to replace advice given to you by your health care provider. Make sure you discuss any questions you have with your health care provider.   Document Released: 03/27/2000 Document Revised: 04/20/2014 Document Reviewed: 11/09/2012 Elsevier Interactive Patient Education [employer].

## 2015-05-16 NOTE — ED Notes (Signed)
Report given to Legrand Como, RN for room ICU-05.

## 2015-05-16 NOTE — ED Notes (Signed)
Seizure pads placed on bed. Pt changed out of wet clothing.

## 2015-05-16 NOTE — ED Notes (Signed)
PT having seizure like activity at this time. New order for Ativan IV obtained.

## 2015-05-16 NOTE — ED Notes (Signed)
Pt had seizure tonight, post ictal on ems arrival. Pt is awake and able to answer questions, sleepy.

## 2015-05-16 NOTE — ED Notes (Signed)
Seizure like activity started again at this time. MD at bedside.

## 2015-05-16 NOTE — ED Notes (Addendum)
Per EMS, pt from home had a witnessed seizure that lasted approx 1 min. Upon EMS arrival, pt was sitting in his wheelchair AOx4. Pt has metastatic lung cancer that he is refusing treatment and palliative care for. Pt d/c from [facility] at 0500 this morning. Pt's only complaint is a HA. Pt was found to be incontinent of urine upon assessment.

## 2015-05-16 NOTE — Progress Notes (Signed)
Assumed care from [place]

## 2015-05-17 ENCOUNTER — Inpatient Hospital Stay (HOSPITAL_COMMUNITY): Payer: Medicare Other

## 2015-05-17 LAB — URINE CULTURE

## 2015-05-17 LAB — CBC WITH DIFFERENTIAL/PLATELET
BASOS ABS: 0 10*3/uL (ref 0.0–0.1)
BASOS PCT: 0 %
EOS ABS: 0.1 10*3/uL (ref 0.0–0.7)
EOS PCT: 1 %
HCT: 43.2 % (ref 39.0–52.0)
Hemoglobin: 14.6 g/dL (ref 13.0–17.0)
Lymphocytes Relative: 15 %
Lymphs Abs: 1.7 10*3/uL (ref 0.7–4.0)
MCH: 29.6 pg (ref 26.0–34.0)
MCHC: 33.8 g/dL (ref 30.0–36.0)
MCV: 87.4 fL (ref 78.0–100.0)
MONO ABS: 0.9 10*3/uL (ref 0.1–1.0)
Monocytes Relative: 8 %
NEUTROS ABS: 9 10*3/uL — AB (ref 1.7–7.7)
Neutrophils Relative %: 76 %
PLATELETS: 185 10*3/uL (ref 150–400)
RBC: 4.94 MIL/uL (ref 4.22–5.81)
RDW: 14.3 % (ref 11.5–15.5)
WBC: 11.7 10*3/uL — ABNORMAL HIGH (ref 4.0–10.5)

## 2015-05-17 LAB — GLUCOSE, CAPILLARY
GLUCOSE-CAPILLARY: 116 mg/dL — AB (ref 65–99)
GLUCOSE-CAPILLARY: 229 mg/dL — AB (ref 65–99)
Glucose-Capillary: 157 mg/dL — ABNORMAL HIGH (ref 65–99)
Glucose-Capillary: 125 mg/dL — ABNORMAL HIGH (ref 65–99)

## 2015-05-17 LAB — COMPREHENSIVE METABOLIC PANEL
ALBUMIN: 3 g/dL — AB (ref 3.5–5.0)
ALK PHOS: 97 U/L (ref 38–126)
ALT: 16 U/L — ABNORMAL LOW (ref 17–63)
ANION GAP: 9 (ref 5–15)
AST: 18 U/L (ref 15–41)
BILIRUBIN TOTAL: 0.6 mg/dL (ref 0.3–1.2)
BUN: 9 mg/dL (ref 6–20)
CALCIUM: 8.1 mg/dL — AB (ref 8.9–10.3)
CO2: 25 mmol/L (ref 22–32)
Chloride: 101 mmol/L (ref 101–111)
Creatinine, Ser: 0.75 mg/dL (ref 0.61–1.24)
GFR calc Af Amer: >60 mL/min (ref >60)
GFR calc non Af Amer: >60 mL/min (ref >60)
GLUCOSE: 125 mg/dL — AB (ref 65–99)
Potassium: 3.9 mmol/L (ref 3.5–5.1)
Sodium: 135 mmol/L (ref 135–145)
TOTAL PROTEIN: 6.8 g/dL (ref 6.5–8.1)

## 2015-05-17 MED ORDER — METFORMIN HCL 500 MG PO TABS
1000.0000 mg | ORAL_TABLET | Freq: Two times a day (BID) | ORAL | Status: DC
  Administered 2015-05-17: 1000 mg via ORAL
  Filled 2015-05-17: qty 2

## 2015-05-17 MED ORDER — CLONIDINE HCL 0.1 MG PO TABS
0.1000 mg | ORAL_TABLET | Freq: Two times a day (BID) | ORAL | Status: DC
  Administered 2015-05-17 – 2015-05-18 (×3): 0.1 mg via ORAL
  Filled 2015-05-17 (×3): qty 1

## 2015-05-17 MED ORDER — PRAVASTATIN SODIUM 10 MG PO TABS
20.0000 mg | ORAL_TABLET | Freq: Every day | ORAL | Status: DC
  Administered 2015-05-17: 20 mg via ORAL
  Filled 2015-05-17: qty 2

## 2015-05-17 MED ORDER — PHENOBARBITAL 97.2 MG PO TABS
97.2000 mg | ORAL_TABLET | Freq: Two times a day (BID) | ORAL | Status: DC
  Administered 2015-05-17 – 2015-05-18 (×2): 97.2 mg via ORAL
  Filled 2015-05-17 (×2): qty 1

## 2015-05-17 MED ORDER — CANAGLIFLOZIN 100 MG PO TABS
100.0000 mg | ORAL_TABLET | Freq: Every day | ORAL | Status: DC
  Administered 2015-05-17 – 2015-05-18 (×2): 100 mg via ORAL
  Filled 2015-05-17 (×4): qty 1

## 2015-05-17 MED ORDER — FUROSEMIDE 20 MG PO TABS
20.0000 mg | ORAL_TABLET | Freq: Every day | ORAL | Status: DC
  Administered 2015-05-17 – 2015-05-18 (×2): 20 mg via ORAL
  Filled 2015-05-17 (×2): qty 1

## 2015-05-17 MED ORDER — DEXAMETHASONE 4 MG PO TABS
4.0000 mg | ORAL_TABLET | Freq: Two times a day (BID) | ORAL | Status: DC
  Administered 2015-05-17 – 2015-05-18 (×3): 4 mg via ORAL
  Filled 2015-05-17 (×6): qty 1

## 2015-05-17 MED ORDER — SODIUM CHLORIDE 0.9 % IV SOLN
INTRAVENOUS | Status: DC
  Administered 2015-05-17 – 2015-05-18 (×2): via INTRAVENOUS

## 2015-05-17 MED ORDER — METOPROLOL SUCCINATE ER 50 MG PO TB24
50.0000 mg | ORAL_TABLET | Freq: Every morning | ORAL | Status: DC
  Administered 2015-05-17 – 2015-05-18 (×2): 50 mg via ORAL
  Filled 2015-05-17 (×2): qty 1

## 2015-05-17 MED ORDER — LOSARTAN POTASSIUM 50 MG PO TABS
100.0000 mg | ORAL_TABLET | Freq: Every morning | ORAL | Status: DC
  Administered 2015-05-17 – 2015-05-18 (×2): 100 mg via ORAL
  Filled 2015-05-17 (×2): qty 2

## 2015-05-17 MED ORDER — LEVETIRACETAM 500 MG PO TABS
1500.0000 mg | ORAL_TABLET | Freq: Two times a day (BID) | ORAL | Status: DC
  Administered 2015-05-17 – 2015-05-18 (×3): 1500 mg via ORAL
  Filled 2015-05-17 (×3): qty 3

## 2015-05-17 MED ORDER — POTASSIUM CHLORIDE CRYS ER 10 MEQ PO TBCR
10.0000 meq | EXTENDED_RELEASE_TABLET | Freq: Every day | ORAL | Status: DC
  Administered 2015-05-17 – 2015-05-18 (×2): 10 meq via ORAL
  Filled 2015-05-17 (×3): qty 1
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
## 2015-05-17 NOTE — Care Management Important Message (Signed)
Important Message  Patient Details  Name: EHAN FREAS MRN: [id number] Date of Birth: 09-03-1960   Medicare Important Message Given:  Yes    Sherald Barge, RN 05/17/2015, 1:20 PM

## 2015-05-18 DIAGNOSIS — C7931 Secondary malignant neoplasm of brain: Secondary | ICD-10-CM

## 2015-05-18 DIAGNOSIS — N289 Disorder of kidney and ureter, unspecified: Secondary | ICD-10-CM

## 2015-05-18 DIAGNOSIS — G40401 Other generalized epilepsy and epileptic syndromes, not intractable, with status epilepticus: Secondary | ICD-10-CM

## 2015-05-18 DIAGNOSIS — G9340 Encephalopathy, unspecified: Secondary | ICD-10-CM

## 2015-05-18 LAB — GLUCOSE, CAPILLARY
GLUCOSE-CAPILLARY: 113 mg/dL — AB (ref 65–99)
Glucose-Capillary: 112 mg/dL — ABNORMAL HIGH (ref 65–99)
Glucose-Capillary: 125 mg/dL — ABNORMAL HIGH (ref 65–99)

## 2015-05-18 LAB — BASIC METABOLIC PANEL
Anion gap: 7 (ref 5–15)
BUN: 9 mg/dL (ref 6–20)
CALCIUM: 8.3 mg/dL — AB (ref 8.9–10.3)
CO2: 27 mmol/L (ref 22–32)
CREATININE: 0.85 mg/dL (ref 0.61–1.24)
Chloride: 101 mmol/L (ref 101–111)
GFR calc non Af Amer: >60 mL/min (ref >60)
GLUCOSE: 118 mg/dL — AB (ref 65–99)
Potassium: 4 mmol/L (ref 3.5–5.1)
Sodium: 135 mmol/L (ref 135–145)

## 2015-05-18 MED ORDER — LEVETIRACETAM 750 MG PO TABS: 1500.0000 mg | ORAL_TABLET | Freq: Two times a day (BID) | ORAL | Status: DC

## 2015-05-18 MED ORDER — DEXAMETHASONE 4 MG PO TABS: 4.0000 mg | ORAL_TABLET | Freq: Two times a day (BID) | ORAL | Status: DC

## 2015-05-18 NOTE — Progress Notes (Signed)
Patient with orders to be discharge home, called sister Regino Schultze and Niece notified. No answer after calling multiple times. Patient is unsafe to return to home with out caregiver present. Dr. Jearld Shines notified.

## 2015-05-18 NOTE — Progress Notes (Signed)
Pt alert and oriented x4. All vitals within baseline. Removed i.v. From right hand. Removed telemetry. Education reviewed and complete. Careplan complete. Reviewed all discharge paperwork with pt. Pt verbalized understanding and signed all discharge paperwork. Prescriptions given. All belongings returned to pt. Nephew here to take pt home. Pt escorted out in a wheelchair by nurse tech.

## 2015-05-18 NOTE — Progress Notes (Signed)
CM communicated to patient regarding his medications.  CM received the phone number for his friend Al [phone].  His friend was to pick up his medications from [facility] [phone].  His friend Al informed me that his medications was delivered to his home and a neighbor picked up medicine.  CM contacted [facility] to try to confirm information but the pharmacy closes at 3:00 pm and closed on Sunday.  CM was not notified earlier to have medications delivered to hospital instead of home. CM to notify doctor of information.
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
## 2015-05-20 ENCOUNTER — Encounter (HOSPITAL_COMMUNITY): Payer: Medicare Other | Attending: Hematology & Oncology | Admitting: Hematology & Oncology

## 2015-05-20 VITALS — BP 109/66 | HR 76 | Temp 97.6°F | Resp 18

## 2015-05-20 DIAGNOSIS — R569 Unspecified convulsions: Secondary | ICD-10-CM | POA: Diagnosis not present

## 2015-05-20 DIAGNOSIS — Z720 Tobacco use: Secondary | ICD-10-CM | POA: Diagnosis not present

## 2015-05-20 DIAGNOSIS — C3491 Malignant neoplasm of unspecified part of right bronchus or lung: Secondary | ICD-10-CM

## 2015-05-20 DIAGNOSIS — Z91199 Patient's noncompliance with other medical treatment and regimen due to unspecified reason: Secondary | ICD-10-CM

## 2015-05-20 DIAGNOSIS — C801 Malignant (primary) neoplasm, unspecified: Secondary | ICD-10-CM

## 2015-05-20 DIAGNOSIS — J949 Pleural condition, unspecified: Secondary | ICD-10-CM

## 2015-05-20 DIAGNOSIS — C7931 Secondary malignant neoplasm of brain: Secondary | ICD-10-CM

## 2015-05-20 DIAGNOSIS — Z9119 Patient's noncompliance with other medical treatment and regimen: Secondary | ICD-10-CM
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
## 2015-05-20 NOTE — Patient Instructions (Addendum)
[place] at [facility] Discharge Instructions  RECOMMENDATIONS MADE BY THE CONSULTANT AND ANY TEST RESULTS WILL BE SENT TO YOUR REFERRING PHYSICIAN.   Exam and discussion by Dr Whitney Muse today Need to get you to follow up with Dr [name]. We will call you with the follow up Please call the clinic if you have any questions or concerns    Thank you for choosing [place] at [facility] to provide your oncology and hematology care.  To afford each patient quality time with our provider, please arrive at least 15 minutes before your scheduled appointment time.   Beginning January 23rd 2017 lab work for the [facility] will be done in the  Main lab at [facility] on 1st floor. If you have a lab appointment with the [facility] please come in thru the  Main Entrance and check in at the main information desk  You need to re-schedule your appointment should you arrive 10 or more minutes late.  We strive to give you quality time with our providers, and arriving late affects you and other patients whose appointments are after yours.  Also, if you no show three or more times for appointments you may be dismissed from the clinic at the providers discretion.     Again, thank you for choosing [facility].  Our hope is that these requests will decrease the amount of time that you wait before being seen by our physicians.       _____________________________________________________________  Should you have questions after your visit to [facility], please contact our office at [phone] between the hours of 8:30 a.m. and 4:30 p.m.  Voicemails left after 4:30 p.m. will not be returned until the following business day.  For prescription refill requests, have your pharmacy contact our office.

## 2015-05-21 ENCOUNTER — Telehealth: Payer: Self-pay | Admitting: Radiation Therapy

## 2015-05-21 LAB — CULTURE, BLOOD (ROUTINE X 2)
CULTURE: NO GROWTH
Culture: NO GROWTH

## 2015-05-21 NOTE — Telephone Encounter (Addendum)
I have called several times to both the patient and his sister, Kiyoshi Schaab, with no answer. There is no option to leave a voicemail on Mr. Lyford line, but I did leave a message requesting a return call on Gladys' line. In addition, I sent out a card to Mr. Burkemper today to let him know that he has an appointment with Dr. [name] on 2/16 and requesting a return call.  Mont Dutton R.T. (R) (T) Radiation Special [facility] [place] [phone] Office [phone] Pager [phone] Fax [email or web address]

## 2015-05-22 ENCOUNTER — Encounter (HOSPITAL_COMMUNITY): Payer: Self-pay | Admitting: Emergency Medicine

## 2015-05-22 ENCOUNTER — Emergency Department (HOSPITAL_COMMUNITY)
Admission: EM | Admit: 2015-05-22 | Discharge: 2015-05-22 | Disposition: A | Discharge status: Home / Self Care | Payer: Medicare Other | Attending: Emergency Medicine | Admitting: Emergency Medicine

## 2015-05-22 ENCOUNTER — Emergency Department (HOSPITAL_COMMUNITY): Payer: Medicare Other

## 2015-05-22 ENCOUNTER — Other Ambulatory Visit: Payer: Self-pay

## 2015-05-22 DIAGNOSIS — E119 Type 2 diabetes mellitus without complications: Secondary | ICD-10-CM | POA: Diagnosis not present

## 2015-05-22 DIAGNOSIS — F17210 Nicotine dependence, cigarettes, uncomplicated: Secondary | ICD-10-CM | POA: Insufficient documentation

## 2015-05-22 DIAGNOSIS — R569 Unspecified convulsions: Secondary | ICD-10-CM

## 2015-05-22 DIAGNOSIS — E7800 Pure hypercholesterolemia, unspecified: Secondary | ICD-10-CM | POA: Insufficient documentation

## 2015-05-22 DIAGNOSIS — Z87828 Personal history of other (healed) physical injury and trauma: Secondary | ICD-10-CM | POA: Insufficient documentation

## 2015-05-22 DIAGNOSIS — Z7984 Long term (current) use of oral hypoglycemic drugs: Secondary | ICD-10-CM | POA: Insufficient documentation

## 2015-05-22 DIAGNOSIS — Z85841 Personal history of malignant neoplasm of brain: Secondary | ICD-10-CM | POA: Insufficient documentation

## 2015-05-22 DIAGNOSIS — Z8619 Personal history of other infectious and parasitic diseases: Secondary | ICD-10-CM | POA: Insufficient documentation

## 2015-05-22 DIAGNOSIS — Z862 Personal history of diseases of the blood and blood-forming organs and certain disorders involving the immune mechanism: Secondary | ICD-10-CM | POA: Diagnosis not present

## 2015-05-22 DIAGNOSIS — M6281 Muscle weakness (generalized): Secondary | ICD-10-CM | POA: Diagnosis not present

## 2015-05-22 DIAGNOSIS — Z7952 Long term (current) use of systemic steroids: Secondary | ICD-10-CM | POA: Diagnosis not present

## 2015-05-22 DIAGNOSIS — Z85118 Personal history of other malignant neoplasm of bronchus and lung: Secondary | ICD-10-CM | POA: Insufficient documentation

## 2015-05-22 DIAGNOSIS — I10 Essential (primary) hypertension: Secondary | ICD-10-CM | POA: Insufficient documentation

## 2015-05-22 DIAGNOSIS — Z79899 Other long term (current) drug therapy: Secondary | ICD-10-CM | POA: Insufficient documentation

## 2015-05-22 DIAGNOSIS — Z8719 Personal history of other diseases of the digestive system: Secondary | ICD-10-CM | POA: Insufficient documentation

## 2015-05-22 LAB — COMPREHENSIVE METABOLIC PANEL
ALBUMIN: 3.8 g/dL (ref 3.5–5.0)
ALK PHOS: 114 U/L (ref 38–126)
ALT: 16 U/L — ABNORMAL LOW (ref 17–63)
AST: 13 U/L — AB (ref 15–41)
Anion gap: 10 (ref 5–15)
BILIRUBIN TOTAL: 0.3 mg/dL (ref 0.3–1.2)
BUN: 21 mg/dL — AB (ref 6–20)
CALCIUM: 9.2 mg/dL (ref 8.9–10.3)
CO2: 25 mmol/L (ref 22–32)
CREATININE: 0.95 mg/dL (ref 0.61–1.24)
Chloride: 100 mmol/L — ABNORMAL LOW (ref 101–111)
GFR calc Af Amer: >60 mL/min (ref >60)
GLUCOSE: 140 mg/dL — AB (ref 65–99)
POTASSIUM: 4.2 mmol/L (ref 3.5–5.1)
Sodium: 135 mmol/L (ref 135–145)
TOTAL PROTEIN: 8 g/dL (ref 6.5–8.1)

## 2015-05-22 LAB — CBC WITH DIFFERENTIAL/PLATELET
BASOS PCT: 0 %
Basophils Absolute: 0 10*3/uL (ref 0.0–0.1)
EOS ABS: 0 10*3/uL (ref 0.0–0.7)
EOS PCT: 0 %
HCT: 45.7 % (ref 39.0–52.0)
Hemoglobin: 15.4 g/dL (ref 13.0–17.0)
LYMPHS PCT: 15 %
Lymphs Abs: 2.1 10*3/uL (ref 0.7–4.0)
MCH: 30.1 pg (ref 26.0–34.0)
MCHC: 33.7 g/dL (ref 30.0–36.0)
MCV: 89.4 fL (ref 78.0–100.0)
MONO ABS: 0.9 10*3/uL (ref 0.1–1.0)
Monocytes Relative: 6 %
Neutro Abs: 10.7 10*3/uL — ABNORMAL HIGH (ref 1.7–7.7)
Neutrophils Relative %: 79 %
PLATELETS: 237 10*3/uL (ref 150–400)
RBC: 5.11 MIL/uL (ref 4.22–5.81)
RDW: 14.9 % (ref 11.5–15.5)
WBC: 13.8 10*3/uL — AB (ref 4.0–10.5)

## 2015-05-22 LAB — URINALYSIS, ROUTINE W REFLEX MICROSCOPIC
Bilirubin Urine: NEGATIVE
GLUCOSE, UA: NEGATIVE mg/dL
Hgb urine dipstick: NEGATIVE
KETONES UR: NEGATIVE mg/dL
LEUKOCYTES UA: NEGATIVE
Nitrite: NEGATIVE
PROTEIN: NEGATIVE mg/dL
Specific Gravity, Urine: 1.02 (ref 1.005–1.030)
pH: 6 (ref 5.0–8.0)

## 2015-05-22 LAB — PHENOBARBITAL LEVEL: PHENOBARBITAL: 7.9 ug/mL — AB (ref 15.0–40.0)

## 2015-05-22 MED ORDER — PHENOBARBITAL 32.4 MG PO TABS
97.2000 mg | ORAL_TABLET | Freq: Once | ORAL | Status: AC
  Administered 2015-05-22: 97.2 mg via ORAL
  Filled 2015-05-22: qty 3

## 2015-05-22 NOTE — ED Notes (Signed)
Sister updated
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
## 2015-05-22 NOTE — Discharge Instructions (Signed)

## 2015-05-22 NOTE — ED Notes (Signed)
Patient was discharge to waiting room via wheelchair, and accompanied to car where niece took him home.

## 2015-05-24 ENCOUNTER — Encounter: Payer: Self-pay | Admitting: *Deleted

## 2015-05-24 NOTE — Progress Notes (Signed)
[facility]  Clinical Social Work was referred by [employer] navigator for transportation concerns  Hol[employer] representative contacted patient by phone to assess for needs.   Mr. Coffield explained he could not use [facility] service until Mid-March.  He stated he plans to talk with his Medicaid caseworker first to determine his options through Medicaid.  CSW requested patient follow up with CSW if his Medicaid caseworker was unable to help.  Patient stated he would not follow up with CSW.    CSW plans to discuss with [facility] CSW and navigator to detemine a plan and will follow up with patient.   Polo Riley, MSW, LCSW, OSW-C Clinical Social Worker [facility] [phone]

## 2015-05-26 ENCOUNTER — Encounter (HOSPITAL_COMMUNITY): Payer: Self-pay | Admitting: Emergency Medicine

## 2015-05-26 ENCOUNTER — Inpatient Hospital Stay (HOSPITAL_COMMUNITY)
Admission: EM | Admit: 2015-05-26 | Discharge: 2015-05-30 | DRG: 871 | Disposition: A | Discharge status: Home Health | Payer: Medicare Other | Attending: Internal Medicine | Admitting: Internal Medicine

## 2015-05-26 DIAGNOSIS — F17210 Nicotine dependence, cigarettes, uncomplicated: Secondary | ICD-10-CM | POA: Diagnosis present

## 2015-05-26 DIAGNOSIS — J690 Pneumonitis due to inhalation of food and vomit: Secondary | ICD-10-CM | POA: Diagnosis present

## 2015-05-26 DIAGNOSIS — C3491 Malignant neoplasm of unspecified part of right bronchus or lung: Secondary | ICD-10-CM | POA: Diagnosis present

## 2015-05-26 DIAGNOSIS — Z809 Family history of malignant neoplasm, unspecified: Secondary | ICD-10-CM

## 2015-05-26 DIAGNOSIS — E7800 Pure hypercholesterolemia, unspecified: Secondary | ICD-10-CM | POA: Diagnosis present

## 2015-05-26 DIAGNOSIS — R066 Hiccough: Secondary | ICD-10-CM | POA: Diagnosis present

## 2015-05-26 DIAGNOSIS — C7931 Secondary malignant neoplasm of brain: Secondary | ICD-10-CM | POA: Diagnosis present

## 2015-05-26 DIAGNOSIS — A419 Sepsis, unspecified organism: Secondary | ICD-10-CM | POA: Diagnosis not present

## 2015-05-26 DIAGNOSIS — R0602 Shortness of breath: Secondary | ICD-10-CM

## 2015-05-26 DIAGNOSIS — I10 Essential (primary) hypertension: Secondary | ICD-10-CM | POA: Diagnosis present

## 2015-05-26 DIAGNOSIS — E785 Hyperlipidemia, unspecified: Secondary | ICD-10-CM | POA: Diagnosis present

## 2015-05-26 DIAGNOSIS — Y95 Nosocomial condition: Secondary | ICD-10-CM | POA: Diagnosis present

## 2015-05-26 DIAGNOSIS — R509 Fever, unspecified: Secondary | ICD-10-CM

## 2015-05-26 DIAGNOSIS — E119 Type 2 diabetes mellitus without complications: Secondary | ICD-10-CM | POA: Diagnosis present

## 2015-05-26 DIAGNOSIS — Z79899 Other long term (current) drug therapy: Secondary | ICD-10-CM

## 2015-05-26 DIAGNOSIS — K219 Gastro-esophageal reflux disease without esophagitis: Secondary | ICD-10-CM | POA: Diagnosis present

## 2015-05-26 DIAGNOSIS — R6510 Systemic inflammatory response syndrome (SIRS) of non-infectious origin without acute organ dysfunction: Secondary | ICD-10-CM | POA: Diagnosis present

## 2015-05-26 DIAGNOSIS — R569 Unspecified convulsions: Secondary | ICD-10-CM | POA: Diagnosis present

## 2015-05-26 DIAGNOSIS — G8191 Hemiplegia, unspecified affecting right dominant side: Secondary | ICD-10-CM | POA: Diagnosis present

## 2015-05-26 DIAGNOSIS — G936 Cerebral edema: Secondary | ICD-10-CM | POA: Diagnosis present

## 2015-05-26 DIAGNOSIS — J189 Pneumonia, unspecified organism: Secondary | ICD-10-CM

## 2015-05-26 DIAGNOSIS — Z7984 Long term (current) use of oral hypoglycemic drugs: Secondary | ICD-10-CM

## 2015-05-26 NOTE — ED Notes (Signed)
Pt c/o shortness of breath and productive cough x 1 day.

## 2015-05-27 ENCOUNTER — Emergency Department (HOSPITAL_COMMUNITY): Payer: Medicare Other

## 2015-05-27 DIAGNOSIS — J189 Pneumonia, unspecified organism: Secondary | ICD-10-CM | POA: Diagnosis not present

## 2015-05-27 DIAGNOSIS — F17210 Nicotine dependence, cigarettes, uncomplicated: Secondary | ICD-10-CM | POA: Diagnosis present

## 2015-05-27 DIAGNOSIS — R569 Unspecified convulsions: Secondary | ICD-10-CM | POA: Diagnosis present

## 2015-05-27 DIAGNOSIS — E785 Hyperlipidemia, unspecified: Secondary | ICD-10-CM | POA: Diagnosis present

## 2015-05-27 DIAGNOSIS — R0602 Shortness of breath: Secondary | ICD-10-CM | POA: Diagnosis present

## 2015-05-27 DIAGNOSIS — I10 Essential (primary) hypertension: Secondary | ICD-10-CM | POA: Diagnosis present

## 2015-05-27 DIAGNOSIS — A419 Sepsis, unspecified organism: Secondary | ICD-10-CM | POA: Diagnosis not present

## 2015-05-27 DIAGNOSIS — C3491 Malignant neoplasm of unspecified part of right bronchus or lung: Secondary | ICD-10-CM | POA: Diagnosis not present

## 2015-05-27 DIAGNOSIS — J690 Pneumonitis due to inhalation of food and vomit: Secondary | ICD-10-CM | POA: Insufficient documentation

## 2015-05-27 DIAGNOSIS — Z79899 Other long term (current) drug therapy: Secondary | ICD-10-CM | POA: Diagnosis not present

## 2015-05-27 DIAGNOSIS — R6510 Systemic inflammatory response syndrome (SIRS) of non-infectious origin without acute organ dysfunction: Secondary | ICD-10-CM | POA: Diagnosis not present

## 2015-05-27 DIAGNOSIS — Z809 Family history of malignant neoplasm, unspecified: Secondary | ICD-10-CM | POA: Diagnosis not present

## 2015-05-27 DIAGNOSIS — C7931 Secondary malignant neoplasm of brain: Secondary | ICD-10-CM

## 2015-05-27 DIAGNOSIS — Y95 Nosocomial condition: Secondary | ICD-10-CM | POA: Diagnosis present

## 2015-05-27 DIAGNOSIS — Z7984 Long term (current) use of oral hypoglycemic drugs: Secondary | ICD-10-CM | POA: Diagnosis not present

## 2015-05-27 DIAGNOSIS — E119 Type 2 diabetes mellitus without complications: Secondary | ICD-10-CM | POA: Diagnosis not present

## 2015-05-27 DIAGNOSIS — G8191 Hemiplegia, unspecified affecting right dominant side: Secondary | ICD-10-CM | POA: Diagnosis present

## 2015-05-27 DIAGNOSIS — G936 Cerebral edema: Secondary | ICD-10-CM | POA: Diagnosis present

## 2015-05-27 DIAGNOSIS — K219 Gastro-esophageal reflux disease without esophagitis: Secondary | ICD-10-CM | POA: Diagnosis present

## 2015-05-27 DIAGNOSIS — E7800 Pure hypercholesterolemia, unspecified: Secondary | ICD-10-CM | POA: Diagnosis present

## 2015-05-27 DIAGNOSIS — R066 Hiccough: Secondary | ICD-10-CM | POA: Diagnosis present

## 2015-05-27 LAB — CBC
HCT: 41.5 % (ref 39.0–52.0)
Hemoglobin: 13.9 g/dL (ref 13.0–17.0)
MCH: 29.4 pg (ref 26.0–34.0)
MCHC: 33.5 g/dL (ref 30.0–36.0)
MCV: 87.7 fL (ref 78.0–100.0)
PLATELETS: 150 10*3/uL (ref 150–400)
RBC: 4.73 MIL/uL (ref 4.22–5.81)
RDW: 14.7 % (ref 11.5–15.5)
WBC: 7.4 10*3/uL (ref 4.0–10.5)

## 2015-05-27 LAB — URINALYSIS, ROUTINE W REFLEX MICROSCOPIC
BILIRUBIN URINE: NEGATIVE
Glucose, UA: NEGATIVE mg/dL
HGB URINE DIPSTICK: NEGATIVE
Leukocytes, UA: NEGATIVE
Nitrite: NEGATIVE
PROTEIN: NEGATIVE mg/dL
Specific Gravity, Urine: 1.02 (ref 1.005–1.030)
pH: 6 (ref 5.0–8.0)

## 2015-05-27 LAB — COMPREHENSIVE METABOLIC PANEL
ALBUMIN: 3.5 g/dL (ref 3.5–5.0)
ALT: 12 U/L — AB (ref 17–63)
ALT: 11 U/L — ABNORMAL LOW (ref 17–63)
ANION GAP: 7 (ref 5–15)
AST: 14 U/L — AB (ref 15–41)
AST: 12 U/L — ABNORMAL LOW (ref 15–41)
Albumin: 2.8 g/dL — ABNORMAL LOW (ref 3.5–5.0)
Alkaline Phosphatase: 99 U/L (ref 38–126)
Alkaline Phosphatase: 78 U/L (ref 38–126)
Anion gap: 9 (ref 5–15)
BUN: 21 mg/dL — AB (ref 6–20)
BUN: 18 mg/dL (ref 6–20)
CALCIUM: 7.7 mg/dL — AB (ref 8.9–10.3)
CHLORIDE: 97 mmol/L — AB (ref 101–111)
CHLORIDE: 103 mmol/L (ref 101–111)
CO2: 29 mmol/L (ref 22–32)
CO2: 23 mmol/L (ref 22–32)
CREATININE: 1.47 mg/dL — AB (ref 0.61–1.24)
Calcium: 9 mg/dL (ref 8.9–10.3)
Creatinine, Ser: 1 mg/dL (ref 0.61–1.24)
GFR calc Af Amer: 60 mL/min — ABNORMAL LOW (ref >60)
GFR calc non Af Amer: 52 mL/min — ABNORMAL LOW (ref >60)
GFR calc non Af Amer: >60 mL/min (ref >60)
GLUCOSE: 122 mg/dL — AB (ref 65–99)
Glucose, Bld: 124 mg/dL — ABNORMAL HIGH (ref 65–99)
Potassium: 3.9 mmol/L (ref 3.5–5.1)
Potassium: 3.7 mmol/L (ref 3.5–5.1)
SODIUM: 135 mmol/L (ref 135–145)
SODIUM: 133 mmol/L — AB (ref 135–145)
Total Bilirubin: 0.5 mg/dL (ref 0.3–1.2)
Total Bilirubin: 0.4 mg/dL (ref 0.3–1.2)
Total Protein: 7 g/dL (ref 6.5–8.1)
Total Protein: 6 g/dL — ABNORMAL LOW (ref 6.5–8.1)

## 2015-05-27 LAB — CBC WITH DIFFERENTIAL/PLATELET
Basophils Absolute: 0 10*3/uL (ref 0.0–0.1)
Basophils Relative: 0 %
EOS ABS: 0.1 10*3/uL (ref 0.0–0.7)
EOS PCT: 1 %
HCT: 46.9 % (ref 39.0–52.0)
HEMOGLOBIN: 15.6 g/dL (ref 13.0–17.0)
LYMPHS ABS: 1 10*3/uL (ref 0.7–4.0)
Lymphocytes Relative: 11 %
MCH: 29.3 pg (ref 26.0–34.0)
MCHC: 33.3 g/dL (ref 30.0–36.0)
MCV: 88.2 fL (ref 78.0–100.0)
MONO ABS: 0.7 10*3/uL (ref 0.1–1.0)
MONOS PCT: 8 %
NEUTROS PCT: 80 %
Neutro Abs: 7 10*3/uL (ref 1.7–7.7)
Platelets: 158 10*3/uL (ref 150–400)
RBC: 5.32 MIL/uL (ref 4.22–5.81)
RDW: 14.7 % (ref 11.5–15.5)
WBC: 8.8 10*3/uL (ref 4.0–10.5)

## 2015-05-27 LAB — INFLUENZA PANEL BY PCR (TYPE A & B)
H1N1 flu by pcr: NOT DETECTED
INFLAPCR: NEGATIVE
Influenza B By PCR: NEGATIVE

## 2015-05-27 LAB — TROPONIN I: Troponin I: <0.03 ng/mL (ref <0.031)

## 2015-05-27 LAB — I-STAT CG4 LACTIC ACID, ED: LACTIC ACID, VENOUS: 2 mmol/L (ref 0.5–2.0)

## 2015-05-27 MED ORDER — TRAMADOL HCL 50 MG PO TABS
50.0000 mg | ORAL_TABLET | Freq: Four times a day (QID) | ORAL | Status: DC | PRN
  Administered 2015-05-27 – 2015-05-28 (×3): 50 mg via ORAL
  Filled 2015-05-27 (×3): qty 1

## 2015-05-27 MED ORDER — PRAVASTATIN SODIUM 10 MG PO TABS
20.0000 mg | ORAL_TABLET | Freq: Every day | ORAL | Status: DC
  Administered 2015-05-27 – 2015-05-29 (×3): 20 mg via ORAL
  Filled 2015-05-27 (×4): qty 2

## 2015-05-27 MED ORDER — IPRATROPIUM BROMIDE 0.02 % IN SOLN: 0.5000 mg | Freq: Four times a day (QID) | RESPIRATORY_TRACT | Status: DC

## 2015-05-27 MED ORDER — ACETAMINOPHEN 500 MG PO TABS
1000.0000 mg | ORAL_TABLET | Freq: Four times a day (QID) | ORAL | Status: DC | PRN
  Administered 2015-05-27 – 2015-05-29 (×3): 1000 mg via ORAL
  Filled 2015-05-27 (×4): qty 2

## 2015-05-27 MED ORDER — SODIUM CHLORIDE 0.9 % IV BOLUS (SEPSIS)
1000.0000 mL | INTRAVENOUS | Status: AC
  Administered 2015-05-27 (×3): 1000 mL via INTRAVENOUS

## 2015-05-27 MED ORDER — ONDANSETRON HCL 4 MG PO TABS: 4.0000 mg | ORAL_TABLET | Freq: Four times a day (QID) | ORAL | Status: DC | PRN

## 2015-05-27 MED ORDER — VANCOMYCIN HCL IN DEXTROSE 1-5 GM/200ML-% IV SOLN
1000.0000 mg | Freq: Once | INTRAVENOUS | Status: AC
  Administered 2015-05-27: 1000 mg via INTRAVENOUS
  Filled 2015-05-27: qty 200

## 2015-05-27 MED ORDER — PIPERACILLIN-TAZOBACTAM 3.375 G IVPB
3.3750 g | Freq: Three times a day (TID) | INTRAVENOUS | Status: DC
  Administered 2015-05-27 – 2015-05-30 (×10): 3.375 g via INTRAVENOUS
  Filled 2015-05-27 (×11): qty 50

## 2015-05-27 MED ORDER — CHLORPROMAZINE HCL 25 MG PO TABS
25.0000 mg | ORAL_TABLET | Freq: Three times a day (TID) | ORAL | Status: DC | PRN
  Administered 2015-05-28 – 2015-05-30 (×3): 25 mg via ORAL
  Filled 2015-05-27 (×5): qty 1

## 2015-05-27 MED ORDER — IPRATROPIUM-ALBUTEROL 0.5-2.5 (3) MG/3ML IN SOLN
3.0000 mL | Freq: Four times a day (QID) | RESPIRATORY_TRACT | Status: DC
  Administered 2015-05-27: 3 mL via RESPIRATORY_TRACT
  Filled 2015-05-27: qty 3

## 2015-05-27 MED ORDER — LEVOFLOXACIN IN D5W 750 MG/150ML IV SOLN
750.0000 mg | Freq: Once | INTRAVENOUS | Status: AC
  Administered 2015-05-27: 750 mg via INTRAVENOUS
  Filled 2015-05-27: qty 150

## 2015-05-27 MED ORDER — SODIUM CHLORIDE 0.9 % IV SOLN
INTRAVENOUS | Status: DC
  Administered 2015-05-27 (×2): via INTRAVENOUS

## 2015-05-27 MED ORDER — CLONIDINE HCL 0.1 MG PO TABS
0.1000 mg | ORAL_TABLET | Freq: Two times a day (BID) | ORAL | Status: DC
Start: 2015-05-27 — End: 2015-05-30
  Administered 2015-05-27 – 2015-05-29 (×6): 0.1 mg via ORAL
  Filled 2015-05-27 (×7): qty 1

## 2015-05-27 MED ORDER — PANTOPRAZOLE SODIUM 40 MG PO TBEC
40.0000 mg | DELAYED_RELEASE_TABLET | Freq: Every day | ORAL | Status: DC
  Administered 2015-05-27 – 2015-05-28 (×2): 40 mg via ORAL
  Filled 2015-05-27 (×2): qty 1

## 2015-05-27 MED ORDER — MORPHINE SULFATE (PF) 2 MG/ML IV SOLN
1.0000 mg | Freq: Four times a day (QID) | INTRAVENOUS | Status: DC | PRN
  Administered 2015-05-28 – 2015-05-29 (×3): 2 mg via INTRAVENOUS
  Filled 2015-05-27 (×4): qty 1

## 2015-05-27 MED ORDER — LEVOFLOXACIN IN D5W 750 MG/150ML IV SOLN: 750.0000 mg | INTRAVENOUS | Status: DC

## 2015-05-27 MED ORDER — DEXAMETHASONE 4 MG PO TABS
4.0000 mg | ORAL_TABLET | Freq: Two times a day (BID) | ORAL | Status: DC
  Administered 2015-05-27 – 2015-05-30 (×7): 4 mg via ORAL
  Filled 2015-05-27 (×9): qty 1

## 2015-05-27 MED ORDER — LEVETIRACETAM 500 MG PO TABS
1500.0000 mg | ORAL_TABLET | Freq: Two times a day (BID) | ORAL | Status: DC
  Administered 2015-05-27 – 2015-05-30 (×7): 1500 mg via ORAL
  Filled 2015-05-27 (×8): qty 3

## 2015-05-27 MED ORDER — ACETAMINOPHEN 500 MG PO TABS
1000.0000 mg | ORAL_TABLET | Freq: Once | ORAL | Status: AC
  Administered 2015-05-27: 1000 mg via ORAL
  Filled 2015-05-27: qty 2

## 2015-05-27 MED ORDER — ONDANSETRON HCL 4 MG/2ML IJ SOLN: 4.0000 mg | Freq: Four times a day (QID) | INTRAMUSCULAR | Status: DC | PRN

## 2015-05-27 MED ORDER — PHENOBARBITAL 97.2 MG PO TABS
97.2000 mg | ORAL_TABLET | Freq: Two times a day (BID) | ORAL | Status: DC
  Administered 2015-05-27 – 2015-05-30 (×7): 97.2 mg via ORAL
  Filled 2015-05-27 (×8): qty 1

## 2015-05-27 MED ORDER — IPRATROPIUM-ALBUTEROL 0.5-2.5 (3) MG/3ML IN SOLN: 3.0000 mL | RESPIRATORY_TRACT | Status: DC | PRN

## 2015-05-27 MED ORDER — MORPHINE SULFATE (PF) 4 MG/ML IV SOLN
4.0000 mg | Freq: Once | INTRAVENOUS | Status: AC
  Administered 2015-05-27: 4 mg via INTRAVENOUS
  Filled 2015-05-27: qty 1

## 2015-05-27 MED ORDER — DEXTROSE 5 % IV SOLN
2.0000 g | Freq: Once | INTRAVENOUS | Status: AC
  Administered 2015-05-27: 2 g via INTRAVENOUS
  Filled 2015-05-27: qty 2

## 2015-05-27 MED ORDER — METOPROLOL SUCCINATE ER 50 MG PO TB24
50.0000 mg | ORAL_TABLET | Freq: Every morning | ORAL | Status: DC
  Administered 2015-05-27 – 2015-05-30 (×4): 50 mg via ORAL
  Filled 2015-05-27 (×4): qty 1

## 2015-05-27 MED ORDER — ALBUTEROL SULFATE (2.5 MG/3ML) 0.083% IN NEBU: 2.5000 mg | INHALATION_SOLUTION | Freq: Four times a day (QID) | RESPIRATORY_TRACT | Status: DC
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
## 2015-05-27 NOTE — Care Management Note (Signed)
Case Management Note  Patient Details  Name: [name] MRN: [id number] Date of Birth: Sep 07, 1960  Subjective/Objective:                  Pt is from home, lives alone and has CAP aid services and family for support. Pt with hemiplegia and requires total care. Pt has wheelchair, hospital bed, BSC and all necessary DME. Pt plans to return home with self care.  Action/Plan: Will cont to follow for DC needs.   Expected Discharge Date:  05/30/15               Expected Discharge Plan:  Home/Self Care  In-House Referral:  NA  Discharge planning Services  CM Consult  Post Acute Care Choice:  NA Choice offered to:  NA  DME Arranged:    DME Agency:     HH Arranged:    HH Agency:     Status of Service:  In process, will continue to follow  Medicare Important Message Given:    Date Medicare IM Given:    Medicare IM give by:    Date Additional Medicare IM Given:    Additional Medicare Important Message give by:     If discussed at [place] of Stay Meetings, dates discussed:    Additional Comments:  Sherald Barge, RN 05/27/2015, 3:05 PM

## 2015-05-27 NOTE — ED Notes (Signed)
4 unsuccessful attempts for repeat lactic, informed Dr. Leonides Schanz.  Verbal order to hold off further sticks for now
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
## 2015-05-27 NOTE — Care Management Obs Status (Signed)
[place] NOTIFICATION   Patient Details  Name: [name] MRN: [id number] Date of Birth: 06/15/1960   Medicare Observation Status Notification Given:  Yes    Sherald Barge, RN 05/27/2015, 3:02 PM
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
## 2015-05-27 NOTE — Progress Notes (Signed)
Patient seen and examined. Admitted  after midnight secondary to shortness of breath, tachypnea and fever. Patient with recent admission secondary to uncontrolled seizures. Chest x-ray without acute infiltrates appreciated; but high concerns for HCAP Versus aspiration pneumonia. Please refer to H&P written by Dr. Darrick Meigs for further info/details on admission.  Plan: -Will start patient on Zosyn  -Provide supportive care -As needed nebulizer treatment -Use of flutter valve -Gentle fluid resuscitation -Follow clinical response   Barton Dubois [phone]

## 2015-05-28 ENCOUNTER — Encounter: Payer: Self-pay | Admitting: Radiation Oncology

## 2015-05-28 ENCOUNTER — Telehealth (HOSPITAL_COMMUNITY): Payer: Self-pay | Admitting: *Deleted

## 2015-05-28 ENCOUNTER — Inpatient Hospital Stay (HOSPITAL_COMMUNITY): Payer: Medicare Other

## 2015-05-28 DIAGNOSIS — G936 Cerebral edema: Secondary | ICD-10-CM

## 2015-05-28 DIAGNOSIS — K219 Gastro-esophageal reflux disease without esophagitis: Secondary | ICD-10-CM

## 2015-05-28 DIAGNOSIS — R066 Hiccough: Secondary | ICD-10-CM

## 2015-05-28 LAB — BASIC METABOLIC PANEL
Anion gap: 7 (ref 5–15)
BUN: 12 mg/dL (ref 6–20)
CHLORIDE: 101 mmol/L (ref 101–111)
CO2: 25 mmol/L (ref 22–32)
CREATININE: 0.95 mg/dL (ref 0.61–1.24)
Calcium: 7.9 mg/dL — ABNORMAL LOW (ref 8.9–10.3)
GFR calc Af Amer: >60 mL/min (ref >60)
Glucose, Bld: 127 mg/dL — ABNORMAL HIGH (ref 65–99)
Potassium: 3.9 mmol/L (ref 3.5–5.1)
SODIUM: 133 mmol/L — AB (ref 135–145)

## 2015-05-28 LAB — CBC
HCT: 44.2 % (ref 39.0–52.0)
Hemoglobin: 14.8 g/dL (ref 13.0–17.0)
MCH: 29.2 pg (ref 26.0–34.0)
MCHC: 33.5 g/dL (ref 30.0–36.0)
MCV: 87.4 fL (ref 78.0–100.0)
PLATELETS: 151 10*3/uL (ref 150–400)
RBC: 5.06 MIL/uL (ref 4.22–5.81)
RDW: 14.7 % (ref 11.5–15.5)
WBC: 6.4 10*3/uL (ref 4.0–10.5)

## 2015-05-28 LAB — URINE CULTURE

## 2015-05-28 MED ORDER — PANTOPRAZOLE SODIUM 40 MG PO TBEC
40.0000 mg | DELAYED_RELEASE_TABLET | Freq: Two times a day (BID) | ORAL | Status: DC
  Administered 2015-05-28 – 2015-05-30 (×4): 40 mg via ORAL
  Filled 2015-05-28 (×4): qty 1

## 2015-05-28 MED ORDER — METOCLOPRAMIDE HCL 5 MG/ML IJ SOLN
5.0000 mg | Freq: Three times a day (TID) | INTRAMUSCULAR | Status: DC | PRN
  Administered 2015-05-29: 5 mg via INTRAVENOUS
  Filled 2015-05-28: qty 2

## 2015-05-28 NOTE — Telephone Encounter (Signed)
PDL1 testing for Keytruda, EGFR, & ALK requested today. Alyse Low confirmed my requests for accession # [id number].
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
## 2015-05-29 ENCOUNTER — Other Ambulatory Visit (HOSPITAL_COMMUNITY)
Admission: RE | Admit: 2015-05-29 | Discharge: 2015-05-29 | Disposition: A | Discharge status: Home / Self Care | Payer: Medicare Other | Source: Ambulatory Visit | Attending: Hematology & Oncology | Admitting: Hematology & Oncology

## 2015-05-29 ENCOUNTER — Ambulatory Visit (HOSPITAL_COMMUNITY): Payer: Medicare Other | Admitting: Hematology & Oncology

## 2015-05-29 DIAGNOSIS — J189 Pneumonia, unspecified organism: Secondary | ICD-10-CM

## 2015-05-29 DIAGNOSIS — J690 Pneumonitis due to inhalation of food and vomit: Secondary | ICD-10-CM

## 2015-05-29 DIAGNOSIS — C3491 Malignant neoplasm of unspecified part of right bronchus or lung: Secondary | ICD-10-CM

## 2015-05-29 DIAGNOSIS — A419 Sepsis, unspecified organism: Principal | ICD-10-CM

## 2015-05-29 LAB — BASIC METABOLIC PANEL
Anion gap: 7 (ref 5–15)
BUN: 11 mg/dL (ref 6–20)
CO2: 25 mmol/L (ref 22–32)
CREATININE: 0.8 mg/dL (ref 0.61–1.24)
Calcium: 7.8 mg/dL — ABNORMAL LOW (ref 8.9–10.3)
Chloride: 103 mmol/L (ref 101–111)
GFR calc Af Amer: >60 mL/min (ref >60)
GLUCOSE: 153 mg/dL — AB (ref 65–99)
POTASSIUM: 3.8 mmol/L (ref 3.5–5.1)
Sodium: 135 mmol/L (ref 135–145)

## 2015-05-29 LAB — CBC
HCT: 44 % (ref 39.0–52.0)
Hemoglobin: 14.7 g/dL (ref 13.0–17.0)
MCH: 28.9 pg (ref 26.0–34.0)
MCHC: 33.4 g/dL (ref 30.0–36.0)
MCV: 86.6 fL (ref 78.0–100.0)
PLATELETS: 134 10*3/uL — AB (ref 150–400)
RBC: 5.08 MIL/uL (ref 4.22–5.81)
RDW: 14.6 % (ref 11.5–15.5)
WBC: 5.6 10*3/uL (ref 4.0–10.5)

## 2015-05-29 MED ORDER — LORAZEPAM 2 MG/ML IJ SOLN: 1.0000 mg | Freq: Once | INTRAMUSCULAR | Status: DC

## 2015-05-29 MED ORDER — LORAZEPAM 1 MG PO TABS
1.0000 mg | ORAL_TABLET | Freq: Once | ORAL | Status: AC
  Administered 2015-05-29: 1 mg via ORAL
  Filled 2015-05-29: qty 1

## 2015-05-29 NOTE — Progress Notes (Signed)
[facility] PROGRESS NOTE  [name] [id number]:[id number] DOB: 03-30-1961 DOA: 05/26/2015 PCP: Craig Savage, MD  Assessment/Plan: SEPSIS:  -present on admission and secondary to PNA -Evidenced by a temperature of 102.6, respiratory rate of 29, heart rate of 103, white count of 13.8 -In the last 24 hours had a low-grade temperature of 100.9. -Since he continues to spike temperatures will continue IV antimicrobial therapy with Zosyn -Plan to repeat chest x-ray in a.m.  HCAP vs Aspiration PNA -Patient presenting with sepsis criteria, initial chest x-ray showed left lower lobe infiltrate. -He is going be treated with IV Zosyn. Had a temperature of 100.9 in the last 24 hours. -Plan to continue IV antimicrobial therapy for the next 24 hours and repeat a chest x-ray in a.m. -Blood cultures obtained on 05/27/2015 remaining sterile 2  Metastatic lung cancer -continue decadron -continue supportive care -follow up with oncology after discharge  Seizure: precipitated by brain metastasis -has been seizure free during this hospitalization -encourage to be compliant with medications at discharge and to avoid even social alcohol intake -continue Keppra and  Luminal  HTN: will continue clonidine and metoprolol  -currently stable  GERD; will continue PPI -will adjust to BID for better control of symptoms   HLD -continue statins   Hiccups -will use PRN thorazine and reglan    Code Status: Full Family Communication: brother and sister at bedside  Disposition Plan: remains inpatient, continue IV antibiotics, follow culture data and clinical response. Plan is for him to go back home when medically stable.   Consultants:  None   Procedures:  See below for x-ray reports   Antibiotics:  Zosyn 2/13  HPI/Subjective: Patient feels little better today, reports improvement to his hiccups. Had a MAXIMUM TEMPERATURE of 100.9 the last 24 hours  Objective: Filed Vitals:   05/28/15  2134 05/29/15 0529  BP: 150/78 131/72  Pulse: 86 77  Temp: 98.4 F (36.9 C) 98.3 F (36.8 C)  Resp: 20 18    Intake/Output Summary (Last 24 hours) at 05/29/15 0756 Last data filed at 05/28/15 1912  Gross per 24 hour  Intake    960 ml  Output   2950 ml  Net  -1990 ml   Filed Weights   05/26/15 2356 05/27/15 0119 05/27/15 0630  Weight: 89.812 kg (198 lb) 89.812 kg (198 lb) 132.5 kg (292 lb 1.8 oz)    Exam:   General:  Feeling better, breathing easier. Still spiking fever and with complaints of intractable hiccups  Cardiovascular: S1 and s2, no rubs or gallops  Respiratory: scattered rhonchi, no wheezing, no use of accessory muscles; decrease breath sounds at bases   Abdomen: soft, NT, ND, positive BS  Musculoskeletal: no edema or cyanosis; right upper extremity with contracture and limited mobility   Data Reviewed: Basic Metabolic Panel:  Recent Labs Lab 05/22/15 1813 05/27/15 0118 05/27/15 0551 05/28/15 0559 05/29/15 0615  NA 135 135 133* 133* 135  K 4.2 3.9 3.7 3.9 3.8  CL 100* 97* 103 101 103  CO2 '25 29 23 25 25  '$ GLUCOSE 140* 122* 124* 127* 153*  BUN 21* 21* '18 12 11  '$ CREATININE 0.95 1.47* 1.00 0.95 0.80  CALCIUM 9.2 9.0 7.7* 7.9* 7.8*   Liver Function Tests:  Recent Labs Lab 05/22/15 1813 05/27/15 0118 05/27/15 0551  AST 13* 14* 12*  ALT 16* 12* 11*  ALKPHOS 114 99 78  BILITOT 0.3 0.5 0.4  PROT 8.0 7.0 6.0*  ALBUMIN 3.8 3.5 2.8*   CBC:  Recent Labs Lab 05/22/15 1813 05/27/15 0118 05/27/15 0551 05/28/15 0559 05/29/15 0615  WBC 13.8* 8.8 7.4 6.4 5.6  NEUTROABS 10.7* 7.0  --   --   --   HGB 15.4 15.6 13.9 14.8 14.7  HCT 45.7 46.9 41.5 44.2 44.0  MCV 89.4 88.2 87.7 87.4 86.6  PLT 237 158 150 151 134*   Cardiac Enzymes:  Recent Labs Lab 05/27/15 0118  TROPONINI <0.03   CBG: No results for input(s): GLUCAP in the last 168 hours.  Recent Results (from the past 240 hour(s))  Urine culture     Status: None   Collection Time:  05/27/15  1:13 AM  Result Value Ref Range Status   Specimen Description URINE, CLEAN CATCH  Final   Special Requests NONE  Final   Culture   Final    MULTIPLE SPECIES PRESENT, SUGGEST RECOLLECTION Performed at [facility]    Report Status 05/28/2015 FINAL  Final  Blood Culture (routine x 2)     Status: None (Preliminary result)   Collection Time: 05/27/15  2:17 AM  Result Value Ref Range Status   Specimen Description LEFT ANTECUBITAL  Final   Special Requests BOTTLES DRAWN AEROBIC AND ANAEROBIC 6CC  Final   Culture NO GROWTH 1 DAY  Final   Report Status PENDING  Incomplete  Blood Culture (routine x 2)     Status: None (Preliminary result)   Collection Time: 05/27/15  2:24 AM  Result Value Ref Range Status   Specimen Description BLOOD LEFT HAND  Final   Special Requests BOTTLES DRAWN AEROBIC AND ANAEROBIC 6CC  Final   Culture NO GROWTH 1 DAY  Final   Report Status PENDING  Incomplete     Studies: Dg Chest Port 1 View  05/28/2015  CLINICAL DATA:  55 year old with recent diagnosis of metastatic non-squamous non-small cell cancer of the right upper lobe. Acute onset of shortness of breath earlier this afternoon. EXAM: PORTABLE CHEST 1 VIEW COMPARISON:  05/27/2015 and earlier. FINDINGS: Right apical lung mass as noted previously. Since yesterday, new patchy airspace opacities at the left lung base. Lungs remain clear otherwise. Cardiac silhouette mildly to moderately enlarged for AP portable technique, unchanged. Mild pulmonary venous hypertension without overt edema. IMPRESSION: 1. Developing atelectasis or bronchopneumonia at the left lung base. 2. Large right apical lung mass as noted previously. Electronically Signed   By: [name] M.D.   On: 05/28/2015 16:53    Scheduled Meds: . cloNIDine  0.1 mg Oral BID  . dexamethasone  4 mg Oral BID WC  . levETIRAcetam  1,500 mg Oral BID  . metoprolol succinate  50 mg Oral q morning - 10a  . pantoprazole  40 mg Oral BID  .  PHENobarbital  97.2 mg Oral BID  . piperacillin-tazobactam (ZOSYN)  IV  3.375 g Intravenous 3 times per day  . pravastatin  20 mg Oral QHS   Continuous Infusions: . 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 If 7PM-7AM, please contact night-coverage at [email or web address], password [email or web address] 05/29/2015, 7:56 AM  LOS: 2 days

## 2015-05-29 NOTE — Progress Notes (Signed)
Patient states that he was having a seizure. This nurse was at the bedside. There were no physical indications that a seizure was taking place but there was about 1 minute between the time that the patient says he was having a seizure and the time that the patient states that the seizure was over. Paged midlevel to notify of this. Order received for ativan PO. Will continue to monitor.
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
## 2015-05-30 ENCOUNTER — Ambulatory Visit: Payer: Medicare Other

## 2015-05-30 ENCOUNTER — Ambulatory Visit
Admission: RE | Admit: 2015-05-30 | Discharge: 2015-05-30 | Disposition: A | Discharge status: Home / Self Care | Payer: Medicare Other | Source: Ambulatory Visit | Attending: Radiation Oncology | Admitting: Radiation Oncology

## 2015-05-30 ENCOUNTER — Inpatient Hospital Stay (HOSPITAL_COMMUNITY): Payer: Medicare Other

## 2015-05-30 DIAGNOSIS — R6510 Systemic inflammatory response syndrome (SIRS) of non-infectious origin without acute organ dysfunction: Secondary | ICD-10-CM

## 2015-05-30 LAB — BASIC METABOLIC PANEL
ANION GAP: 9 (ref 5–15)
BUN: 9 mg/dL (ref 6–20)
CO2: 24 mmol/L (ref 22–32)
Calcium: 7.9 mg/dL — ABNORMAL LOW (ref 8.9–10.3)
Chloride: 102 mmol/L (ref 101–111)
Creatinine, Ser: 0.84 mg/dL (ref 0.61–1.24)
Glucose, Bld: 172 mg/dL — ABNORMAL HIGH (ref 65–99)
POTASSIUM: 3.8 mmol/L (ref 3.5–5.1)
SODIUM: 135 mmol/L (ref 135–145)

## 2015-05-30 LAB — CBC
HCT: 43 % (ref 39.0–52.0)
HEMOGLOBIN: 14.2 g/dL (ref 13.0–17.0)
MCH: 28.6 pg (ref 26.0–34.0)
MCHC: 33 g/dL (ref 30.0–36.0)
MCV: 86.7 fL (ref 78.0–100.0)
PLATELETS: 145 10*3/uL — AB (ref 150–400)
RBC: 4.96 MIL/uL (ref 4.22–5.81)
RDW: 14.5 % (ref 11.5–15.5)
WBC: 4.5 10*3/uL (ref 4.0–10.5)

## 2015-05-30 MED ORDER — AMOXICILLIN-POT CLAVULANATE 875-125 MG PO TABS: 1.0000 | ORAL_TABLET | Freq: Two times a day (BID) | ORAL | Status: DC

## 2015-05-30 MED ORDER — CHLORPROMAZINE HCL 25 MG PO TABS: 25.0000 mg | ORAL_TABLET | Freq: Three times a day (TID) | ORAL | Status: DC | PRN

## 2015-05-30 MED ORDER — CLONIDINE HCL 0.1 MG PO TABS
0.1000 mg | ORAL_TABLET | Freq: Three times a day (TID) | ORAL | Status: DC
  Administered 2015-05-30: 0.1 mg via ORAL
  Filled 2015-05-30: qty 1

## 2015-05-30 NOTE — Care Management Important Message (Signed)
Important Message  Patient Details  Name: [name] MRN: [id number] Date of Birth: 1960/11/21   Medicare Important Message Given:  Yes    Sherald Barge, RN 05/30/2015, 10:20 AM

## 2015-05-30 NOTE — Care Management Note (Addendum)
Case Management Note  Patient Details  Name: [name] MRN: [id number] Date of Birth: 07-05-1960  Expected Discharge Date:  05/30/15               Expected Discharge Plan:  [place]  In-House Referral:  Clinical Social Work  Discharge planning Services  CM Consult  Post Acute Care Choice:  NA Choice offered to:  NA  DME Arranged:    DME Agency:     HH Arranged:  RN, PT [place] Agency:  [place]  Status of Service:  Completed, signed off  Medicare Important Message Given:  Yes Date Medicare IM Given:    Medicare IM give by:    Date Additional Medicare IM Given:    Additional Medicare Important Message give by:     If discussed at [place] of Stay Meetings, dates discussed:    Additional Comments: Pt discharging home today. PT has recommended SNF and pt has refused. Pt is agreeable to [facility] PT in the home. Pt has chosen [facility] for [facility] services. Pt states he has a lift and will require no DME at DC. Blake Divine, of [facility], made aware of referral and will obtain pt info from chart. Pt understands that [facility] has 48 hours to initiate services at DC. Pt has requested his case worker York Spaniel and sister Murice Barbar be called and notified of DC. CM has called these contacts. Pt has requested EMS transport home. CSW made aware pt will need transport.   Sherald Barge, RN 05/30/2015, 10:52 AM

## 2015-05-30 NOTE — Progress Notes (Signed)
PT Cancellation Note  Patient Details Name: [name] MRN: [id number] DOB: 11/25/1960   Cancelled Treatment:    Reason Eval/Treat Not Completed: Other (comment). PT spoke with pt at length regarding DC recommendations and rationale, at the request of care management in the setting of patient refusal. The pt offers no clear concerns about DC to STR/SNF, aside from wanting to be at home at all costs. PT expressed concerns over current deconditioned strength, in light of limited assistance at home (3H/D). PT is concerned that in current state, pt is very likely to sustain a fall to floor during attempts at toilet transfers and/or pt will refrain from toileting when alone, with the typical complications that follow either bowel/bladder retention or prolonged skin breakdown from insufficient perineal hygiene. PT also expressed that pt is most likely to return to PLOF quickly with higher intensity and higher frequency skilled PT services than what would be offered in home. Pt listens to concerns and posits that he remains determined to DC to home only. Pt reports he has a sister and brother in [place] who are able to help him whenever he needs it. He also reports that he has a mechanical lift in home, but he does not need it. Pt does not exhibit a cogent awareness of his current deconditioned state, nor does he relate it to any anticipated difficulty he will have upon return to home. PT reminded patient that he is currently a +3 for all mobility, and that he will likely need the lift for transfers at home for a couple weeks until he returns to baseline. Pt reports he is 'not much for self pitty.' PT recommendations remain for DC to STR/SNF both for increased physical assistance and high intensity/high frequency PT services. Pt remains steadfast in his calm, gentle opposition to these recommendations. He is agreeable to PT services in home at DC.    10:45 AM, 05/30/2015 Etta Grandchild, PT, DPT PRN  Physical Therapist at [place] License # [id number] [phone] (wireless)  [phone] (mobile)
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
## 2015-05-30 NOTE — Care Management (Signed)
CM contacted by [facility] rep who states they will be unable to admit pt into their services, they feel he would best be served by hospice. Pt's second choice for [facility] services, [facility], contacted, reviewed pt info and will be able to provide pt with nursing and PT services. Pt made aware.

## 2015-05-31 ENCOUNTER — Ambulatory Visit: Payer: Medicare Other | Admitting: Radiation Oncology

## 2015-05-31 ENCOUNTER — Ambulatory Visit: Admission: RE | Admit: 2015-05-31 | Payer: Medicare Other | Source: Ambulatory Visit

## 2015-06-01 LAB — CULTURE, BLOOD (ROUTINE X 2)
Culture: NO GROWTH
Culture: NO GROWTH

## 2015-06-02 ENCOUNTER — Encounter (HOSPITAL_COMMUNITY): Payer: Self-pay | Admitting: Hematology & Oncology

## 2015-06-02 DIAGNOSIS — Z91199 Patient's noncompliance with other medical treatment and regimen due to unspecified reason: Secondary | ICD-10-CM | POA: Insufficient documentation

## 2015-06-02 DIAGNOSIS — Z9119 Patient's noncompliance with other medical treatment and regimen: Secondary | ICD-10-CM | POA: Insufficient documentation

## 2015-06-04 ENCOUNTER — Encounter (HOSPITAL_BASED_OUTPATIENT_CLINIC_OR_DEPARTMENT_OTHER): Payer: Medicare Other | Admitting: Hematology & Oncology

## 2015-06-04 ENCOUNTER — Ambulatory Visit (HOSPITAL_COMMUNITY): Payer: Medicare Other | Admitting: Hematology & Oncology

## 2015-06-04 ENCOUNTER — Encounter (HOSPITAL_COMMUNITY): Payer: Self-pay

## 2015-06-04 ENCOUNTER — Encounter (HOSPITAL_COMMUNITY): Payer: Self-pay | Admitting: Hematology & Oncology

## 2015-06-04 VITALS — BP 156/85 | HR 77 | Temp 97.6°F | Resp 18

## 2015-06-04 DIAGNOSIS — R569 Unspecified convulsions: Secondary | ICD-10-CM | POA: Diagnosis not present

## 2015-06-04 DIAGNOSIS — C7931 Secondary malignant neoplasm of brain: Secondary | ICD-10-CM | POA: Diagnosis not present

## 2015-06-04 DIAGNOSIS — Z720 Tobacco use: Secondary | ICD-10-CM

## 2015-06-04 DIAGNOSIS — C3490 Malignant neoplasm of unspecified part of unspecified bronchus or lung: Secondary | ICD-10-CM

## 2015-06-04 DIAGNOSIS — C3491 Malignant neoplasm of unspecified part of right bronchus or lung: Secondary | ICD-10-CM
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
## 2015-06-04 NOTE — Patient Instructions (Addendum)
[facility] Discharge Instructions  RECOMMENDATIONS MADE BY THE CONSULTANT AND ANY TEST RESULTS WILL BE SENT TO YOUR REFERRING PHYSICIAN.   Exam and discussion by Dr Whitney Muse today [name] is a new medication that we can use to treat your lung cancer. This is not a typical chemotherapy, its an immunotherapy, it does not cause normal nausea or vomiting This is an IV medication, it is given every 21 days  If Dr [name] radiates your brain and lung then we will not have to do any further treatment [name]) at this time.   Your appt with Dr [name] is March 6 at 1:30pm. Return to see the doctor about 2 weeks after you see Dr [name]  Please call the clinic if you have any questions or concerns     Thank you for choosing [facility] at [facility] to provide your oncology and hematology care.  To afford each patient quality time with our provider, please arrive at least 15 minutes before your scheduled appointment time.   Beginning January 23rd 2017 lab work for the [facility] will be done in the  Main lab at [facility] on 1st floor. If you have a lab appointment with the [facility] please come in thru the  Main Entrance and check in at the main information desk  You need to re-schedule your appointment should you arrive 10 or more minutes late.  We strive to give you quality time with our providers, and arriving late affects you and other patients whose appointments are after yours.  Also, if you no show three or more times for appointments you may be dismissed from the clinic at the providers discretion.     Again, thank you for choosing [facility].  Our hope is that these requests will decrease the amount of time that you wait before being seen by our physicians.       _____________________________________________________________  Should you have questions after your visit to [facility], please  contact our office at [phone] between the hours of 8:30 a.m. and 4:30 p.m.  Voicemails left after 4:30 p.m. will not be returned until the following business day.  For prescription refill requests, have your pharmacy contact our office.         Pembrolizumab injection What is this medicine? PEMBROLIZUMAB (pem broe liz ue mab) is a monoclonal antibody. It is used to treat melanoma and non-small cell lung cancer. This medicine may be used for other purposes; ask your health care provider or pharmacist if you have questions. What should I tell my health care provider before I take this medicine? They need to know if you have any of these conditions: -diabetes -immune system problems -inflammatory bowel disease -liver disease -lung or breathing disease -lupus -an unusual or allergic reaction to pembrolizumab, other medicines, foods, dyes, or preservatives -pregnant or trying to get pregnant -breast-feeding How should I use this medicine? This medicine is for infusion into a vein. It is given by a health care professional in a hospital or clinic setting. A special MedGuide will be given to you before each treatment. Be sure to read this information carefully each time. Talk to your pediatrician regarding the use of this medicine in children. Special care may be needed. Overdosage: If you think you have taken too much of this medicine contact a poison control center or emergency room at once. NOTE: This medicine is only for you.  Do not share this medicine with others. What if I miss a dose? It is important not to miss your dose. Call your doctor or health care professional if you are unable to keep an appointment. What may interact with this medicine? Interactions have not been studied. Give your health care provider a list of all the medicines, herbs, non-prescription drugs, or dietary supplements you use. Also tell them if you smoke, drink alcohol, or use illegal drugs. Some items  may interact with your medicine. This list may not describe all possible interactions. Give your health care provider a list of all the medicines, herbs, non-prescription drugs, or dietary supplements you use. Also tell them if you smoke, drink alcohol, or use illegal drugs. Some items may interact with your medicine. What should I watch for while using this medicine? Your condition will be monitored carefully while you are receiving this medicine. You may need blood work done while you are taking this medicine. Do not become pregnant while taking this medicine or for 4 months after stopping it. Women should inform their doctor if they wish to become pregnant or think they might be pregnant. There is a potential for serious side effects to an unborn child. Talk to your health care professional or pharmacist for more information. Do not breast-feed an infant while taking this medicine or for 4 months after the last dose. What side effects may I notice from receiving this medicine? Side effects that you should report to your doctor or health care professional as soon as possible: -allergic reactions like skin rash, itching or hives, swelling of the face, lips, or tongue -bloody or black, tarry stools -breathing problems -change in the amount of urine -changes in vision -chest pain -chills -dark urine -dizziness or feeling faint or lightheaded -fast or irregular heartbeat -fever -flushing -hair loss -muscle pain -muscle weakness -persistent headache -signs and symptoms of high blood sugar such as dizziness; dry mouth; dry skin; fruity breath; nausea; stomach pain; increased hunger or thirst; increased urination -signs and symptoms of liver injury like dark urine, light-colored stools, loss of appetite, nausea, right upper belly pain, yellowing of the eyes or skin -stomach pain -weight loss Side effects that usually do not require medical attention (Report these to your doctor or health care  professional if they continue or are bothersome.):constipation -cough -diarrhea -joint pain -tiredness This list may not describe all possible side effects. Call your doctor for medical advice about side effects. You may report side effects to FDA at 1-800-FDA-1088. Where should I keep my medicine? This drug is given in a hospital or clinic and will not be stored at home. NOTE: This sheet is a summary. It may not cover all possible information. If you have questions about this medicine, talk to your doctor, pharmacist, or health care provider.    2016, Elsevier/Gold Standard. (2014-05-29 17:24:19)

## 2015-06-06 ENCOUNTER — Emergency Department (HOSPITAL_COMMUNITY): Payer: Medicare Other

## 2015-06-06 ENCOUNTER — Emergency Department (HOSPITAL_COMMUNITY)
Admission: EM | Admit: 2015-06-06 | Discharge: 2015-06-06 | Disposition: A | Discharge status: Home / Self Care | Payer: Medicare Other | Attending: Emergency Medicine | Admitting: Emergency Medicine

## 2015-06-06 ENCOUNTER — Encounter (HOSPITAL_COMMUNITY): Payer: Self-pay | Admitting: Emergency Medicine

## 2015-06-06 DIAGNOSIS — Z91040 Latex allergy status: Secondary | ICD-10-CM | POA: Insufficient documentation

## 2015-06-06 DIAGNOSIS — E119 Type 2 diabetes mellitus without complications: Secondary | ICD-10-CM | POA: Insufficient documentation

## 2015-06-06 DIAGNOSIS — Z85118 Personal history of other malignant neoplasm of bronchus and lung: Secondary | ICD-10-CM | POA: Insufficient documentation

## 2015-06-06 DIAGNOSIS — Z79899 Other long term (current) drug therapy: Secondary | ICD-10-CM | POA: Diagnosis not present

## 2015-06-06 DIAGNOSIS — Y9289 Other specified places as the place of occurrence of the external cause: Secondary | ICD-10-CM | POA: Diagnosis not present

## 2015-06-06 DIAGNOSIS — M6281 Muscle weakness (generalized): Secondary | ICD-10-CM | POA: Diagnosis not present

## 2015-06-06 DIAGNOSIS — Z7952 Long term (current) use of systemic steroids: Secondary | ICD-10-CM | POA: Insufficient documentation

## 2015-06-06 DIAGNOSIS — E7800 Pure hypercholesterolemia, unspecified: Secondary | ICD-10-CM | POA: Insufficient documentation

## 2015-06-06 DIAGNOSIS — S40021A Contusion of right upper arm, initial encounter: Secondary | ICD-10-CM | POA: Insufficient documentation

## 2015-06-06 DIAGNOSIS — Z7984 Long term (current) use of oral hypoglycemic drugs: Secondary | ICD-10-CM | POA: Insufficient documentation

## 2015-06-06 DIAGNOSIS — Z8619 Personal history of other infectious and parasitic diseases: Secondary | ICD-10-CM | POA: Insufficient documentation

## 2015-06-06 DIAGNOSIS — Z8669 Personal history of other diseases of the nervous system and sense organs: Secondary | ICD-10-CM | POA: Insufficient documentation

## 2015-06-06 DIAGNOSIS — Z23 Encounter for immunization: Secondary | ICD-10-CM | POA: Insufficient documentation

## 2015-06-06 DIAGNOSIS — F17210 Nicotine dependence, cigarettes, uncomplicated: Secondary | ICD-10-CM | POA: Insufficient documentation

## 2015-06-06 DIAGNOSIS — Y998 Other external cause status: Secondary | ICD-10-CM | POA: Insufficient documentation

## 2015-06-06 DIAGNOSIS — Y9389 Activity, other specified: Secondary | ICD-10-CM | POA: Insufficient documentation

## 2015-06-06 DIAGNOSIS — W19XXXA Unspecified fall, initial encounter: Secondary | ICD-10-CM

## 2015-06-06 DIAGNOSIS — R569 Unspecified convulsions: Secondary | ICD-10-CM | POA: Insufficient documentation

## 2015-06-06 DIAGNOSIS — I10 Essential (primary) hypertension: Secondary | ICD-10-CM | POA: Diagnosis not present

## 2015-06-06 DIAGNOSIS — S51011A Laceration without foreign body of right elbow, initial encounter: Secondary | ICD-10-CM | POA: Diagnosis not present

## 2015-06-06 DIAGNOSIS — T07XXXA Unspecified multiple injuries, initial encounter: Secondary | ICD-10-CM

## 2015-06-06 DIAGNOSIS — S4991XA Unspecified injury of right shoulder and upper arm, initial encounter: Secondary | ICD-10-CM | POA: Diagnosis present

## 2015-06-06 DIAGNOSIS — Z8719 Personal history of other diseases of the digestive system: Secondary | ICD-10-CM | POA: Diagnosis not present

## 2015-06-06 LAB — URINALYSIS, ROUTINE W REFLEX MICROSCOPIC
Bilirubin Urine: NEGATIVE
GLUCOSE, UA: NEGATIVE mg/dL
KETONES UR: NEGATIVE mg/dL
Leukocytes, UA: NEGATIVE
Nitrite: NEGATIVE
Specific Gravity, Urine: 1.025 (ref 1.005–1.030)
pH: 6 (ref 5.0–8.0)

## 2015-06-06 LAB — COMPREHENSIVE METABOLIC PANEL
ALT: 20 U/L (ref 17–63)
ANION GAP: 9 (ref 5–15)
AST: 21 U/L (ref 15–41)
Albumin: 3.3 g/dL — ABNORMAL LOW (ref 3.5–5.0)
Alkaline Phosphatase: 101 U/L (ref 38–126)
BUN: 16 mg/dL (ref 6–20)
CHLORIDE: 101 mmol/L (ref 101–111)
CO2: 28 mmol/L (ref 22–32)
CREATININE: 0.92 mg/dL (ref 0.61–1.24)
Calcium: 8.8 mg/dL — ABNORMAL LOW (ref 8.9–10.3)
GFR calc non Af Amer: >60 mL/min (ref >60)
Glucose, Bld: 169 mg/dL — ABNORMAL HIGH (ref 65–99)
Potassium: 3.7 mmol/L (ref 3.5–5.1)
Sodium: 138 mmol/L (ref 135–145)
Total Bilirubin: 0.8 mg/dL (ref 0.3–1.2)
Total Protein: 7.3 g/dL (ref 6.5–8.1)

## 2015-06-06 LAB — URINE MICROSCOPIC-ADD ON
BACTERIA UA: NONE SEEN
RBC / HPF: NONE SEEN RBC/hpf (ref 0–5)
Squamous Epithelial / LPF: NONE SEEN
WBC UA: NONE SEEN WBC/hpf (ref 0–5)

## 2015-06-06 LAB — CK: CK TOTAL: 533 U/L — AB (ref 49–397)

## 2015-06-06 MED ORDER — HYDROCODONE-ACETAMINOPHEN 5-325 MG PO TABS: 1.0000 | ORAL_TABLET | ORAL | Status: DC | PRN

## 2015-06-06 MED ORDER — TETANUS-DIPHTH-ACELL PERTUSSIS 5-2.5-18.5 LF-MCG/0.5 IM SUSP
0.5000 mL | Freq: Once | INTRAMUSCULAR | Status: AC
  Administered 2015-06-06: 0.5 mL via INTRAMUSCULAR
  Filled 2015-06-06 (×2): qty 0.5

## 2015-06-06 MED ORDER — HYDROCODONE-ACETAMINOPHEN 5-325 MG PO TABS
1.0000 | ORAL_TABLET | ORAL | Status: AC | PRN
Start: 2015-06-06 — End: ?

## 2015-06-06 MED ORDER — HYDROCODONE-ACETAMINOPHEN 5-325 MG PO TABS
2.0000 | ORAL_TABLET | Freq: Once | ORAL | Status: AC
Start: 2015-06-06 — End: 2015-06-06
  Administered 2015-06-06: 2 via ORAL
  Filled 2015-06-06: qty 2

## 2015-06-06 NOTE — ED Notes (Signed)
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 Pt denies head trauma loc.  Pt alert and oriented.  Vital WNL.

## 2015-06-06 NOTE — ED Notes (Signed)
Pt is a quadriplegic with some gross motor function to all extremities. Pt reports sliding out of wheelchair into floor while attempting to put on shoe sometime early this am. Pt states he did not have his life alert on and laid in floor 3-4 hours, until his home health aid found him. Pt c/o pain to right upper arm. Abrasion to right elbow. Denies hitting head/loc. nad noted.

## 2015-06-06 NOTE — ED Notes (Signed)
[facility]. 

## 2015-06-06 NOTE — Discharge Instructions (Signed)
Your xray is negative. Your lab test are negative for acute muscle injury from being on the floor. Your tetanus status was updated today. Please make your doctor aware for your medical records, and also update your personal records. Contusion A contusion is a deep bruise. Contusions happen when an injury causes bleeding under the skin. Symptoms of bruising include pain, swelling, and discolored skin. The skin may turn blue, purple, or yellow. HOME CARE   Rest the injured area.  If told, put ice on the injured area.  Put ice in a plastic bag.  Place a towel between your skin and the bag.  Leave the ice on for 20 minutes, 2-3 times per day.  If told, put light pressure (compression) on the injured area using an elastic bandage. Make sure the bandage is not too tight. Remove it and put it back on as told by your doctor.  If possible, raise (elevate) the injured area above the level of your heart while you are sitting or lying down.  Take over-the-counter and prescription medicines only as told by your doctor. GET HELP IF:  Your symptoms do not get better after several days of treatment.  Your symptoms get worse.  You have trouble moving the injured area. GET HELP RIGHT AWAY IF:   You have very bad pain.  You have a loss of feeling (numbness) in a hand or foot.  Your hand or foot turns pale or cold.   This information is not intended to replace advice given to you by your health care provider. Make sure you discuss any questions you have with your health care provider.   Document Released: 09/16/2007 Document Revised: 12/19/2014 Document Reviewed: 08/15/2014 Elsevier Interactive Patient Education [employer].
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
## 2015-06-07 ENCOUNTER — Encounter (HOSPITAL_COMMUNITY): Payer: Self-pay

## 2015-06-07 ENCOUNTER — Encounter: Payer: Self-pay | Admitting: *Deleted

## 2015-06-07 NOTE — Progress Notes (Signed)
[place] Work  Clinical Social Work was referred by patient navigator for assistance with transportation.  Clinical Social Worker contacted Oceanographer at [employer]. Per Reyne Dumas, he can help transport pt to appointments until [facility] restarts. He reports he had brought pt to [facility], but the appt was cancelled. CSW discussed with caseworker other possible resources that could assist pt and he feels pt could receive CAPs services at home. Pt currently has an aide a few hours a day. Mr Doree Fudge can bring pt to first appts in March and can assist in setting up CAPs. CSW stressed to Mr Doree Fudge to please let us know if we can assist in arranging these additional resources. He agreed. Mr Doree Fudge has CSW contact and will reach out as needs arise.    Clinical Social Work interventions: Set designer assistance  Loren Racer, [place] Social Worker [place]  [place] Phone: [phone] Fax: [phone]

## 2015-06-11 ENCOUNTER — Encounter (HOSPITAL_COMMUNITY): Payer: Self-pay

## 2015-06-11 ENCOUNTER — Emergency Department (HOSPITAL_COMMUNITY): Payer: Medicare Other

## 2015-06-11 ENCOUNTER — Inpatient Hospital Stay (HOSPITAL_COMMUNITY)
Admission: EM | Admit: 2015-06-11 | Discharge: 2015-06-14 | DRG: 054 | Disposition: A | Discharge status: Home Health | Payer: Medicare Other | Attending: Internal Medicine | Admitting: Internal Medicine

## 2015-06-11 DIAGNOSIS — R6510 Systemic inflammatory response syndrome (SIRS) of non-infectious origin without acute organ dysfunction: Secondary | ICD-10-CM

## 2015-06-11 DIAGNOSIS — R1013 Epigastric pain: Secondary | ICD-10-CM

## 2015-06-11 DIAGNOSIS — Z7189 Other specified counseling: Secondary | ICD-10-CM | POA: Insufficient documentation

## 2015-06-11 DIAGNOSIS — G9340 Encephalopathy, unspecified: Secondary | ICD-10-CM

## 2015-06-11 DIAGNOSIS — L8990 Pressure ulcer of unspecified site, unspecified stage: Secondary | ICD-10-CM

## 2015-06-11 DIAGNOSIS — R040 Epistaxis: Secondary | ICD-10-CM

## 2015-06-11 DIAGNOSIS — Z7984 Long term (current) use of oral hypoglycemic drugs: Secondary | ICD-10-CM

## 2015-06-11 DIAGNOSIS — G40401 Other generalized epilepsy and epileptic syndromes, not intractable, with status epilepticus: Secondary | ICD-10-CM

## 2015-06-11 DIAGNOSIS — G936 Cerebral edema: Secondary | ICD-10-CM | POA: Diagnosis present

## 2015-06-11 DIAGNOSIS — Z809 Family history of malignant neoplasm, unspecified: Secondary | ICD-10-CM

## 2015-06-11 DIAGNOSIS — E785 Hyperlipidemia, unspecified: Secondary | ICD-10-CM

## 2015-06-11 DIAGNOSIS — E7800 Pure hypercholesterolemia, unspecified: Secondary | ICD-10-CM | POA: Diagnosis present

## 2015-06-11 DIAGNOSIS — C7931 Secondary malignant neoplasm of brain: Principal | ICD-10-CM | POA: Diagnosis present

## 2015-06-11 DIAGNOSIS — E872 Acidosis, unspecified: Secondary | ICD-10-CM

## 2015-06-11 DIAGNOSIS — E119 Type 2 diabetes mellitus without complications: Secondary | ICD-10-CM

## 2015-06-11 DIAGNOSIS — K2100 Gastro-esophageal reflux disease with esophagitis, without bleeding: Secondary | ICD-10-CM

## 2015-06-11 DIAGNOSIS — R627 Adult failure to thrive: Secondary | ICD-10-CM | POA: Diagnosis present

## 2015-06-11 DIAGNOSIS — F17210 Nicotine dependence, cigarettes, uncomplicated: Secondary | ICD-10-CM | POA: Diagnosis present

## 2015-06-11 DIAGNOSIS — Z515 Encounter for palliative care: Secondary | ICD-10-CM | POA: Insufficient documentation

## 2015-06-11 DIAGNOSIS — Z66 Do not resuscitate: Secondary | ICD-10-CM | POA: Diagnosis present

## 2015-06-11 DIAGNOSIS — G40909 Epilepsy, unspecified, not intractable, without status epilepticus: Secondary | ICD-10-CM | POA: Diagnosis present

## 2015-06-11 DIAGNOSIS — R531 Weakness: Secondary | ICD-10-CM | POA: Insufficient documentation

## 2015-06-11 DIAGNOSIS — Z720 Tobacco use: Secondary | ICD-10-CM

## 2015-06-11 DIAGNOSIS — Z9119 Patient's noncompliance with other medical treatment and regimen: Secondary | ICD-10-CM

## 2015-06-11 DIAGNOSIS — C3491 Malignant neoplasm of unspecified part of right bronchus or lung: Secondary | ICD-10-CM

## 2015-06-11 DIAGNOSIS — Z86010 Personal history of colonic polyps: Secondary | ICD-10-CM

## 2015-06-11 DIAGNOSIS — R569 Unspecified convulsions: Secondary | ICD-10-CM

## 2015-06-11 DIAGNOSIS — G8190 Hemiplegia, unspecified affecting unspecified side: Secondary | ICD-10-CM

## 2015-06-11 DIAGNOSIS — F329 Major depressive disorder, single episode, unspecified: Secondary | ICD-10-CM | POA: Diagnosis present

## 2015-06-11 DIAGNOSIS — Z91199 Patient's noncompliance with other medical treatment and regimen due to unspecified reason: Secondary | ICD-10-CM

## 2015-06-11 DIAGNOSIS — G40901 Epilepsy, unspecified, not intractable, with status epilepticus: Secondary | ICD-10-CM

## 2015-06-11 DIAGNOSIS — E876 Hypokalemia: Secondary | ICD-10-CM

## 2015-06-11 DIAGNOSIS — I10 Essential (primary) hypertension: Secondary | ICD-10-CM | POA: Diagnosis present

## 2015-06-11 DIAGNOSIS — N289 Disorder of kidney and ureter, unspecified: Secondary | ICD-10-CM

## 2015-06-11 DIAGNOSIS — K625 Hemorrhage of anus and rectum: Secondary | ICD-10-CM

## 2015-06-11 DIAGNOSIS — Z79899 Other long term (current) drug therapy: Secondary | ICD-10-CM

## 2015-06-11 DIAGNOSIS — J690 Pneumonitis due to inhalation of food and vomit: Secondary | ICD-10-CM

## 2015-06-11 DIAGNOSIS — R0602 Shortness of breath: Secondary | ICD-10-CM

## 2015-06-11 DIAGNOSIS — K219 Gastro-esophageal reflux disease without esophagitis: Secondary | ICD-10-CM | POA: Diagnosis present

## 2015-06-11 DIAGNOSIS — R310 Gross hematuria: Secondary | ICD-10-CM

## 2015-06-11 LAB — LIPASE, BLOOD: Lipase: 27 U/L (ref 11–51)

## 2015-06-11 LAB — CBC
HCT: 45.1 % (ref 39.0–52.0)
HEMOGLOBIN: 14.9 g/dL (ref 13.0–17.0)
MCH: 29.3 pg (ref 26.0–34.0)
MCHC: 33 g/dL (ref 30.0–36.0)
MCV: 88.8 fL (ref 78.0–100.0)
PLATELETS: 225 10*3/uL (ref 150–400)
RBC: 5.08 MIL/uL (ref 4.22–5.81)
RDW: 14.4 % (ref 11.5–15.5)
WBC: 10.2 10*3/uL (ref 4.0–10.5)

## 2015-06-11 LAB — COMPREHENSIVE METABOLIC PANEL
ALBUMIN: 3.7 g/dL (ref 3.5–5.0)
ALK PHOS: 103 U/L (ref 38–126)
ALT: 15 U/L — ABNORMAL LOW (ref 17–63)
ANION GAP: 11 (ref 5–15)
AST: 18 U/L (ref 15–41)
BILIRUBIN TOTAL: 0.3 mg/dL (ref 0.3–1.2)
BUN: 19 mg/dL (ref 6–20)
CALCIUM: 8.9 mg/dL (ref 8.9–10.3)
CO2: 21 mmol/L — ABNORMAL LOW (ref 22–32)
Chloride: 102 mmol/L (ref 101–111)
Creatinine, Ser: 0.9 mg/dL (ref 0.61–1.24)
GFR calc non Af Amer: >60 mL/min (ref >60)
GLUCOSE: 108 mg/dL — AB (ref 65–99)
POTASSIUM: 4.2 mmol/L (ref 3.5–5.1)
SODIUM: 134 mmol/L — AB (ref 135–145)
TOTAL PROTEIN: 8.2 g/dL — AB (ref 6.5–8.1)

## 2015-06-11 LAB — PHENOBARBITAL LEVEL: Phenobarbital: 15.9 ug/mL (ref 15.0–40.0)

## 2015-06-11 LAB — URINALYSIS, ROUTINE W REFLEX MICROSCOPIC
BILIRUBIN URINE: NEGATIVE
Glucose, UA: NEGATIVE mg/dL
HGB URINE DIPSTICK: NEGATIVE
Ketones, ur: NEGATIVE mg/dL
Leukocytes, UA: NEGATIVE
NITRITE: NEGATIVE
PROTEIN: NEGATIVE mg/dL
SPECIFIC GRAVITY, URINE: 1.025 (ref 1.005–1.030)
pH: 5.5 (ref 5.0–8.0)

## 2015-06-11 LAB — RAPID URINE DRUG SCREEN, HOSP PERFORMED
Amphetamines: NOT DETECTED
Barbiturates: POSITIVE — AB
Benzodiazepines: NOT DETECTED
COCAINE: NOT DETECTED
OPIATES: POSITIVE — AB
Tetrahydrocannabinol: POSITIVE — AB

## 2015-06-11 LAB — ETHANOL

## 2015-06-11 LAB — CBG MONITORING, ED: GLUCOSE-CAPILLARY: 107 mg/dL — AB (ref 65–99)

## 2015-06-11 LAB — TROPONIN I

## 2015-06-11 LAB — I-STAT CG4 LACTIC ACID, ED: LACTIC ACID, VENOUS: 1.32 mmol/L (ref 0.5–2.0)
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
## 2015-06-11 NOTE — ED Notes (Signed)
Patient brought by [facility] from home for generalized weakness and abdominal pain. Reports of 1 episode of diarrhea. Denies vomiting.
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
## 2015-06-11 NOTE — ED Notes (Signed)
Pt doses not stand,hemiplegia

## 2015-06-11 NOTE — ED Notes (Signed)
Pt denies abdominal pain, pt states " my belly is sore" diarrhea x 1, no nausea or vomiting

## 2015-06-12 ENCOUNTER — Encounter (HOSPITAL_COMMUNITY): Payer: Self-pay | Admitting: Internal Medicine

## 2015-06-12 DIAGNOSIS — K210 Gastro-esophageal reflux disease with esophagitis: Secondary | ICD-10-CM | POA: Diagnosis not present

## 2015-06-12 DIAGNOSIS — R531 Weakness: Secondary | ICD-10-CM | POA: Insufficient documentation

## 2015-06-12 DIAGNOSIS — C3491 Malignant neoplasm of unspecified part of right bronchus or lung: Secondary | ICD-10-CM

## 2015-06-12 DIAGNOSIS — R569 Unspecified convulsions: Secondary | ICD-10-CM

## 2015-06-12 DIAGNOSIS — Z7984 Long term (current) use of oral hypoglycemic drugs: Secondary | ICD-10-CM | POA: Diagnosis not present

## 2015-06-12 DIAGNOSIS — R627 Adult failure to thrive: Secondary | ICD-10-CM | POA: Diagnosis present

## 2015-06-12 DIAGNOSIS — C7931 Secondary malignant neoplasm of brain: Secondary | ICD-10-CM | POA: Insufficient documentation

## 2015-06-12 DIAGNOSIS — E7800 Pure hypercholesterolemia, unspecified: Secondary | ICD-10-CM | POA: Diagnosis present

## 2015-06-12 DIAGNOSIS — I10 Essential (primary) hypertension: Secondary | ICD-10-CM | POA: Diagnosis not present

## 2015-06-12 DIAGNOSIS — G9340 Encephalopathy, unspecified: Secondary | ICD-10-CM | POA: Diagnosis not present

## 2015-06-12 DIAGNOSIS — Z7189 Other specified counseling: Secondary | ICD-10-CM | POA: Diagnosis not present

## 2015-06-12 DIAGNOSIS — Z66 Do not resuscitate: Secondary | ICD-10-CM | POA: Diagnosis present

## 2015-06-12 DIAGNOSIS — F17210 Nicotine dependence, cigarettes, uncomplicated: Secondary | ICD-10-CM | POA: Diagnosis present

## 2015-06-12 DIAGNOSIS — K219 Gastro-esophageal reflux disease without esophagitis: Secondary | ICD-10-CM | POA: Diagnosis present

## 2015-06-12 DIAGNOSIS — E119 Type 2 diabetes mellitus without complications: Secondary | ICD-10-CM | POA: Diagnosis present

## 2015-06-12 DIAGNOSIS — F329 Major depressive disorder, single episode, unspecified: Secondary | ICD-10-CM | POA: Diagnosis present

## 2015-06-12 DIAGNOSIS — L8990 Pressure ulcer of unspecified site, unspecified stage: Secondary | ICD-10-CM | POA: Insufficient documentation

## 2015-06-12 DIAGNOSIS — Z9119 Patient's noncompliance with other medical treatment and regimen: Secondary | ICD-10-CM | POA: Diagnosis not present

## 2015-06-12 DIAGNOSIS — G40909 Epilepsy, unspecified, not intractable, without status epilepticus: Secondary | ICD-10-CM | POA: Diagnosis present

## 2015-06-12 DIAGNOSIS — Z515 Encounter for palliative care: Secondary | ICD-10-CM | POA: Diagnosis not present

## 2015-06-12 DIAGNOSIS — Z79899 Other long term (current) drug therapy: Secondary | ICD-10-CM | POA: Diagnosis not present

## 2015-06-12 DIAGNOSIS — Z809 Family history of malignant neoplasm, unspecified: Secondary | ICD-10-CM | POA: Diagnosis not present

## 2015-06-12 DIAGNOSIS — G936 Cerebral edema: Secondary | ICD-10-CM | POA: Diagnosis present

## 2015-06-12 LAB — CBC
HEMATOCRIT: 43.9 % (ref 39.0–52.0)
HEMOGLOBIN: 14.6 g/dL (ref 13.0–17.0)
MCH: 29.4 pg (ref 26.0–34.0)
MCHC: 33.3 g/dL (ref 30.0–36.0)
MCV: 88.3 fL (ref 78.0–100.0)
Platelets: 201 10*3/uL (ref 150–400)
RBC: 4.97 MIL/uL (ref 4.22–5.81)
RDW: 14.1 % (ref 11.5–15.5)
WBC: 8.9 10*3/uL (ref 4.0–10.5)

## 2015-06-12 LAB — CREATININE, SERUM: CREATININE: 0.83 mg/dL (ref 0.61–1.24)

## 2015-06-12 LAB — GLUCOSE, CAPILLARY
GLUCOSE-CAPILLARY: 193 mg/dL — AB (ref 65–99)
GLUCOSE-CAPILLARY: 189 mg/dL — AB (ref 65–99)
Glucose-Capillary: 141 mg/dL — ABNORMAL HIGH (ref 65–99)
Glucose-Capillary: 134 mg/dL — ABNORMAL HIGH (ref 65–99)
Glucose-Capillary: 192 mg/dL — ABNORMAL HIGH (ref 65–99)

## 2015-06-12 LAB — AMMONIA: AMMONIA: 24 umol/L (ref 9–35)

## 2015-06-12 LAB — TSH: TSH: 0.692 u[IU]/mL (ref 0.350–4.500)

## 2015-06-12 MED ORDER — PANTOPRAZOLE SODIUM 40 MG PO TBEC
40.0000 mg | DELAYED_RELEASE_TABLET | Freq: Every day | ORAL | Status: DC
  Administered 2015-06-12 – 2015-06-14 (×3): 40 mg via ORAL
  Filled 2015-06-12 (×3): qty 1

## 2015-06-12 MED ORDER — PRAVASTATIN SODIUM 10 MG PO TABS
20.0000 mg | ORAL_TABLET | Freq: Every day | ORAL | Status: DC
  Administered 2015-06-12 – 2015-06-13 (×2): 20 mg via ORAL
  Filled 2015-06-12 (×2): qty 2

## 2015-06-12 MED ORDER — FUROSEMIDE 20 MG PO TABS
20.0000 mg | ORAL_TABLET | Freq: Every day | ORAL | Status: DC
  Administered 2015-06-12 – 2015-06-14 (×3): 20 mg via ORAL
  Filled 2015-06-12 (×3): qty 1

## 2015-06-12 MED ORDER — METOPROLOL SUCCINATE ER 50 MG PO TB24
50.0000 mg | ORAL_TABLET | Freq: Every morning | ORAL | Status: DC
  Administered 2015-06-12 – 2015-06-14 (×3): 50 mg via ORAL
  Filled 2015-06-12 (×3): qty 1

## 2015-06-12 MED ORDER — HEPARIN SODIUM (PORCINE) 5000 UNIT/ML IJ SOLN
5000.0000 [IU] | Freq: Three times a day (TID) | INTRAMUSCULAR | Status: DC
  Administered 2015-06-12 – 2015-06-14 (×7): 5000 [IU] via SUBCUTANEOUS
  Filled 2015-06-12 (×7): qty 1

## 2015-06-12 MED ORDER — ACETAMINOPHEN 500 MG PO TABS: 1000.0000 mg | ORAL_TABLET | Freq: Four times a day (QID) | ORAL | Status: DC | PRN

## 2015-06-12 MED ORDER — PHENOBARBITAL 97.2 MG PO TABS
97.2000 mg | ORAL_TABLET | Freq: Two times a day (BID) | ORAL | Status: DC
  Administered 2015-06-12 – 2015-06-14 (×5): 97.2 mg via ORAL
  Filled 2015-06-12 (×5): qty 1

## 2015-06-12 MED ORDER — INSULIN ASPART 100 UNIT/ML ~~LOC~~ SOLN
0.0000 [IU] | SUBCUTANEOUS | Status: DC
  Administered 2015-06-12: 3 [IU] via SUBCUTANEOUS
  Administered 2015-06-12 (×2): 2 [IU] via SUBCUTANEOUS
  Administered 2015-06-12 – 2015-06-13 (×3): 3 [IU] via SUBCUTANEOUS
  Administered 2015-06-13: 8 [IU] via SUBCUTANEOUS
  Administered 2015-06-13: 5 [IU] via SUBCUTANEOUS
  Administered 2015-06-13 (×3): 3 [IU] via SUBCUTANEOUS
  Administered 2015-06-14 (×2): 2 [IU] via SUBCUTANEOUS
  Administered 2015-06-14: 3 [IU] via SUBCUTANEOUS
  Administered 2015-06-14: 5 [IU] via SUBCUTANEOUS

## 2015-06-12 MED ORDER — DEXAMETHASONE SODIUM PHOSPHATE 10 MG/ML IJ SOLN
10.0000 mg | Freq: Four times a day (QID) | INTRAMUSCULAR | Status: DC
  Administered 2015-06-12 – 2015-06-14 (×8): 10 mg via INTRAVENOUS
  Filled 2015-06-12 (×9): qty 1

## 2015-06-12 MED ORDER — LEVETIRACETAM 500 MG PO TABS
1500.0000 mg | ORAL_TABLET | Freq: Two times a day (BID) | ORAL | Status: DC
  Administered 2015-06-12 – 2015-06-13 (×3): 1500 mg via ORAL
  Filled 2015-06-12 (×3): qty 3

## 2015-06-12 MED ORDER — POTASSIUM CHLORIDE CRYS ER 10 MEQ PO TBCR
10.0000 meq | EXTENDED_RELEASE_TABLET | Freq: Every day | ORAL | Status: DC
Start: 2015-06-12 — End: 2015-06-14
  Administered 2015-06-12 – 2015-06-14 (×3): 10 meq via ORAL
  Filled 2015-06-12 (×9): qty 1

## 2015-06-12 MED ORDER — LOSARTAN POTASSIUM 50 MG PO TABS
100.0000 mg | ORAL_TABLET | Freq: Every morning | ORAL | Status: DC
  Administered 2015-06-12 – 2015-06-14 (×3): 100 mg via ORAL
  Filled 2015-06-12 (×3): qty 2

## 2015-06-12 MED ORDER — DEXAMETHASONE SODIUM PHOSPHATE 4 MG/ML IJ SOLN
10.0000 mg | Freq: Once | INTRAMUSCULAR | Status: AC
  Administered 2015-06-12: 10 mg via INTRAVENOUS
  Filled 2015-06-12: qty 3

## 2015-06-12 MED ORDER — CLONIDINE HCL 0.1 MG PO TABS
0.1000 mg | ORAL_TABLET | Freq: Two times a day (BID) | ORAL | Status: DC
  Administered 2015-06-12 – 2015-06-14 (×5): 0.1 mg via ORAL
  Filled 2015-06-12 (×5): qty 1

## 2015-06-12 NOTE — ED Notes (Signed)
Pt cleaned and new brief placed under pt

## 2015-06-12 NOTE — ED Notes (Signed)
Report given to Brandy RN on 300 

## 2015-06-12 NOTE — Consult Note (Signed)
Oncology is consulted.   Chart is reviewed.  Patient is re-admitted with encephalopathy.  Providing cancer care for this patient has been very difficult given patient noncompliance and resistance to appropriate oncology care.  He is scheduled for out-patient XRT consultation with Dr. [name] in [place] on 3/6 after missing his mid-Feb consult due to previous hospitalization.    Given his recurrent hospitalizations, [name] would be best served with palliative and hospice interventions.    Patient not seen.  No further medical oncology input is available at this time.  Will discuss case with Georgiann Hahn, NP [facility] medicine) if needed.  Robynn Pane, PA-C 06/12/2015 6:24 PM
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
## 2015-06-12 NOTE — Plan of Care (Signed)
Report received by night nurse. Assume care of pt. [place], [place].

## 2015-06-12 NOTE — Plan of Care (Signed)
Report given to Russ Halo, RN. End of shift. [place], [place].
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
## 2015-06-13 ENCOUNTER — Encounter (HOSPITAL_COMMUNITY): Payer: Self-pay

## 2015-06-13 ENCOUNTER — Ambulatory Visit (HOSPITAL_COMMUNITY): Payer: Medicare Other | Admitting: Hematology & Oncology

## 2015-06-13 ENCOUNTER — Encounter (HOSPITAL_COMMUNITY): Payer: Self-pay | Admitting: Primary Care

## 2015-06-13 DIAGNOSIS — Z515 Encounter for palliative care: Secondary | ICD-10-CM | POA: Insufficient documentation

## 2015-06-13 DIAGNOSIS — Z7189 Other specified counseling: Secondary | ICD-10-CM | POA: Insufficient documentation

## 2015-06-13 LAB — BASIC METABOLIC PANEL
ANION GAP: 9 (ref 5–15)
BUN: 14 mg/dL (ref 6–20)
CALCIUM: 8.4 mg/dL — AB (ref 8.9–10.3)
CO2: 25 mmol/L (ref 22–32)
Chloride: 101 mmol/L (ref 101–111)
Creatinine, Ser: 0.75 mg/dL (ref 0.61–1.24)
GLUCOSE: 166 mg/dL — AB (ref 65–99)
Potassium: 3.9 mmol/L (ref 3.5–5.1)
SODIUM: 135 mmol/L (ref 135–145)

## 2015-06-13 LAB — CBC
HCT: 42.9 % (ref 39.0–52.0)
HEMOGLOBIN: 14.2 g/dL (ref 13.0–17.0)
MCH: 29 pg (ref 26.0–34.0)
MCHC: 33.1 g/dL (ref 30.0–36.0)
MCV: 87.6 fL (ref 78.0–100.0)
PLATELETS: 221 10*3/uL (ref 150–400)
RBC: 4.9 MIL/uL (ref 4.22–5.81)
RDW: 13.8 % (ref 11.5–15.5)
WBC: 12.5 10*3/uL — ABNORMAL HIGH (ref 4.0–10.5)

## 2015-06-13 LAB — GLUCOSE, CAPILLARY
GLUCOSE-CAPILLARY: 241 mg/dL — AB (ref 65–99)
GLUCOSE-CAPILLARY: 186 mg/dL — AB (ref 65–99)
Glucose-Capillary: 132 mg/dL — ABNORMAL HIGH (ref 65–99)
Glucose-Capillary: 173 mg/dL — ABNORMAL HIGH (ref 65–99)
Glucose-Capillary: 193 mg/dL — ABNORMAL HIGH (ref 65–99)
Glucose-Capillary: 200 mg/dL — ABNORMAL HIGH (ref 65–99)
Glucose-Capillary: 271 mg/dL — ABNORMAL HIGH (ref 65–99)

## 2015-06-13 MED ORDER — LEVETIRACETAM 500 MG PO TABS
2000.0000 mg | ORAL_TABLET | Freq: Two times a day (BID) | ORAL | Status: DC
  Administered 2015-06-13 – 2015-06-14 (×2): 2000 mg via ORAL
  Filled 2015-06-13 (×2): qty 4

## 2015-06-13 MED ORDER — CHLORPROMAZINE HCL 25 MG/ML IJ SOLN
25.0000 mg | Freq: Three times a day (TID) | INTRAMUSCULAR | Status: DC | PRN
  Administered 2015-06-13: 25 mg via INTRAMUSCULAR
  Filled 2015-06-13 (×3): qty 1

## 2015-06-13 NOTE — Progress Notes (Signed)
Present with patient for support. Much of his discussion centered around life review-brain injury at 89, mother's and sister's deaths, his life choices in light of his current diagnosis. Aided in processing regarding decisions YP:PJKD and future. Discussed his faith/coping. Will follow up with him.
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
## 2015-06-14 ENCOUNTER — Encounter: Payer: Self-pay | Admitting: *Deleted

## 2015-06-14 DIAGNOSIS — Z7189 Other specified counseling: Secondary | ICD-10-CM

## 2015-06-14 DIAGNOSIS — G9340 Encephalopathy, unspecified: Secondary | ICD-10-CM

## 2015-06-14 LAB — CBC
HCT: 41.7 % (ref 39.0–52.0)
Hemoglobin: 14.1 g/dL (ref 13.0–17.0)
MCH: 29.5 pg (ref 26.0–34.0)
MCHC: 33.8 g/dL (ref 30.0–36.0)
MCV: 87.2 fL (ref 78.0–100.0)
PLATELETS: 210 10*3/uL (ref 150–400)
RBC: 4.78 MIL/uL (ref 4.22–5.81)
RDW: 13.9 % (ref 11.5–15.5)
WBC: 12.8 10*3/uL — ABNORMAL HIGH (ref 4.0–10.5)

## 2015-06-14 LAB — MAGNESIUM: Magnesium: 1.8 mg/dL (ref 1.7–2.4)

## 2015-06-14 LAB — BASIC METABOLIC PANEL
Anion gap: 8 (ref 5–15)
BUN: 16 mg/dL (ref 6–20)
CALCIUM: 8.5 mg/dL — AB (ref 8.9–10.3)
CO2: 24 mmol/L (ref 22–32)
CREATININE: 0.72 mg/dL (ref 0.61–1.24)
Chloride: 104 mmol/L (ref 101–111)
Glucose, Bld: 173 mg/dL — ABNORMAL HIGH (ref 65–99)
Potassium: 4.1 mmol/L (ref 3.5–5.1)
SODIUM: 136 mmol/L (ref 135–145)

## 2015-06-14 LAB — GLUCOSE, CAPILLARY
GLUCOSE-CAPILLARY: 147 mg/dL — AB (ref 65–99)
GLUCOSE-CAPILLARY: 198 mg/dL — AB (ref 65–99)
Glucose-Capillary: 207 mg/dL — ABNORMAL HIGH (ref 65–99)

## 2015-06-14 MED ORDER — CHLORPROMAZINE HCL 25 MG PO TABS: 25.0000 mg | ORAL_TABLET | Freq: Three times a day (TID) | ORAL | Status: AC | PRN

## 2015-06-14 MED ORDER — DEXAMETHASONE 4 MG PO TABS: 4.0000 mg | ORAL_TABLET | Freq: Four times a day (QID) | ORAL | Status: AC

## 2015-06-14 MED ORDER — DEXAMETHASONE 4 MG PO TABS
4.0000 mg | ORAL_TABLET | Freq: Four times a day (QID) | ORAL | Status: DC
  Administered 2015-06-14 (×2): 4 mg via ORAL
  Filled 2015-06-14 (×14): qty 1

## 2015-06-14 MED ORDER — PHENOBARBITAL 97.2 MG PO TABS: 97.2000 mg | ORAL_TABLET | Freq: Two times a day (BID) | ORAL | Status: AC

## 2015-06-14 MED ORDER — LEVETIRACETAM 1000 MG PO TABS: 2000.0000 mg | ORAL_TABLET | Freq: Two times a day (BID) | ORAL | Status: AC

## 2015-06-14 NOTE — Care Management Note (Signed)
Case Management Note  Patient Details  Name: [name] MRN: [id number] Date of Birth: 1961/03/18  Subjective/Objective:                  Pt is from home, lives alone, has aid a few hrs each day. Pt has strong family support. Pt is hemiplegic. Pt wheelchair bound. PT has recommended SNF, Palliative recommends hospice services. Pt states he wishes to continue treatments in the hope of getting better. Pt refuses SNF. Pt wishes to return home with his aid and family support. Pt also active with the [facility]. Reyne Dumas, pt's SW through the [facility] made aware of DC home. Through that program pt will receive follow up visits by EMS, CSW and other disciplines through [facility]. Pt also [place] with [facility].   Action/Plan: Jeannette How, of [facility], made aware of DC and info will be obtained from pt chart. Pt transported to home via EMS. No further CM needs a this time.    Expected Discharge Date:  06/14/15               Expected Discharge Plan:  [place]  In-House Referral:  NA  Discharge planning Services  CM Consult  Post Acute Care Choice:  Resumption of Svcs/PTA Provider, Home Health Choice offered to:  Patient  DME Arranged:    DME Agency:     HH Arranged:  RN [place] Agency:  [place]  Status of Service:  In process, will continue to follow  Medicare Important Message Given:    yes Date Medicare IM Given:    Medicare IM give by:    Date Additional Medicare IM Given:    Additional Medicare Important Message give by:     If discussed at [place] of Stay Meetings, dates discussed:    Additional Comments:  Sherald Barge, RN 06/14/2015, 1:47 PM

## 2015-06-14 NOTE — Progress Notes (Signed)
PT Cancellation Note  Patient Details Name: [name] MRN: [id number] DOB: 1961/04/12   Cancelled Treatment:    Reason Eval/Treat Not Completed: PT screened, no needs identified, will sign off. Pt is familiar to this PT from prior admission. PT recommending SNF at last admission, which patient refused. Pt has now decided to utilize hospice services at DC, and despite facility placement discussed again with PMT, pt continues to prefer DC to home, and would like to die at home. At baseline, pt has chronic heavy physical limitations in mobility, but remains in his power wheelchair most of the day and night. Per last PT evaluation 3 weeks ago, pt has all recommended equipment for care in home. No PT needed at this time. Will Sign off.     9:23 AM, 06/14/2015 Etta Grandchild, PT, DPT PRN Physical Therapist at [place] License # [id number] [phone] (wireless)  [phone] (mobile)

## 2015-06-14 NOTE — Progress Notes (Signed)
Patient discharged home with family, discharge instructions and RX's explained to patient and family verbalized understanding. Transported By [facility]  Discharge paper work sent by EMS. No c/o pain or shortness of breath at d/c.  Tamiko Leopard, Tivis Ringer, RN

## 2015-06-14 NOTE — Progress Notes (Signed)
Daily Progress Note   Patient Name: [name]       Date: 06/14/2015 DOB: 01/22/1961  Age: 55 y.o. MRN#: [id number] Attending Physician: No att. providers found Primary Care Physician: [name], MD Admit Date: 06/11/2015  Reason for Consultation/Follow-up: Disposition, Establishing goals of care and Psychosocial/spiritual support  Subjective: [name] is sitting up in bed, his nephew at his bedside. He tells me that he is supposed to go home today. He shares that he wants to continue to seek treatment for his cancer. We talk about his appointment with radiation oncology on Monday. He shares that his caseworker has set up transportation for him. He shares that he is not ready to take hospice services at this time.   He shares that he wants his 2 sisters [name] and [name] to make health care decisions for him if he is unable. His nephew says this is a good idea. We prepare paperwork for his advanced directives but are unable to have this completed before he is discharged.  We talk about enrolling in hospice whenever he is ready.His nephew states, "hospice people are real nice".   I [name] with he and his nephew our concerns over his expected functional declines. His nephew shares that he and his cousins are trying to find a way to make sure that he has the care he needs at home.  We talk about curative versus palliative treatments.  Length of Stay: 2 days  Current Medications: Scheduled Meds:  . cloNIDine  0.1 mg Oral BID  . dexamethasone  4 mg Oral 4 times per day  . furosemide  20 mg Oral Daily  . heparin  5,000 Units Subcutaneous 3 times per day  . insulin aspart  0-15 Units Subcutaneous 6 times per day  . levETIRAcetam  2,000 mg Oral BID  . losartan  100 mg Oral q morning - 10a  .  metoprolol succinate  50 mg Oral q morning - 10a  . pantoprazole  40 mg Oral Daily  . PHENobarbital  97.2 mg Oral BID  . potassium chloride  10 mEq Oral Daily  . pravastatin  20 mg Oral QHS    Continuous Infusions:    PRN Meds: acetaminophen, chlorproMAZINE (THORAZINE) injection  Physical Exam: Physical Exam  Constitutional: He is oriented to person, place, and  time. No distress.  Cardiovascular: Normal rate.   Pulmonary/Chest: Effort normal and breath sounds normal. No respiratory distress.  Abdominal: Soft. He exhibits no distension.  Neurological: He is alert and oriented to person, place, and time.  Skin: Skin is warm and dry.  Nursing note and vitals reviewed.               Vital Signs: BP 122/78 mmHg  Pulse 84  Temp(Src) 98.1 F (36.7 C) (Oral)  Resp 20  Ht '6\' 2"'$  (1.88 m)  Wt 81.8 kg (180 lb 5.4 oz)  BMI 23.14 kg/m2  SpO2 98% SpO2: SpO2: 98 % O2 Device: O2 Device: Not Delivered O2 Flow Rate:    Intake/output summary:  Intake/Output Summary (Last 24 hours) at 06/14/15 1248 Last data filed at 06/14/15 0955  Gross per 24 hour  Intake    240 ml  Output    650 ml  Net   -410 ml   LBM: Last BM Date: 06/13/15 Baseline Weight: Weight: 132.45 kg (292 lb) Most recent weight: Weight: 81.8 kg (180 lb 5.4 oz)       Palliative Assessment/Data: Flowsheet Rows        Most Recent Value   Intake Tab    Referral Department  Hospitalist   Unit at Time of Referral  Med/Surg Unit   Palliative Care Primary Diagnosis  Cancer   Date Notified  06/12/15   Palliative Care Type  New Palliative care   Reason for referral  Clarify Goals of Care   Date of Admission  06/11/15   Date first seen by Palliative Care  06/12/15   # of days Palliative referral response time  0 Day(s)   # of days IP prior to Palliative referral  1   Clinical Assessment    Palliative Performance Scale Score  30%   Pain Max last 24 hours  0   Pain Min Last 24 hours  0   Dyspnea Max Last 24 Hours  0    Dyspnea Min Last 24 hours  0   Psychosocial & Spiritual Assessment    Palliative Care Outcomes    Patient/Family meeting held?  Yes   Who was at the meeting?  patient, 2 sisters, neice.    Palliative Care Outcomes  Provided psychosocial or spiritual support   Palliative Care follow-up planned  -- [will continue to follow in hospital]      Additional Data Reviewed: CBC    Component Value Date/Time   WBC 12.8* 06/14/2015 0547   RBC 4.78 06/14/2015 0547   HGB 14.1 06/14/2015 0547   HCT 41.7 06/14/2015 0547   PLT 210 06/14/2015 0547   MCV 87.2 06/14/2015 0547   MCH 29.5 06/14/2015 0547   MCHC 33.8 06/14/2015 0547   RDW 13.9 06/14/2015 0547   LYMPHSABS 1.0 05/27/2015 0118   MONOABS 0.7 05/27/2015 0118   EOSABS 0.1 05/27/2015 0118   BASOSABS 0.0 05/27/2015 0118    CMP     Component Value Date/Time   NA 136 06/14/2015 0547   K 4.1 06/14/2015 0547   CL 104 06/14/2015 0547   CO2 24 06/14/2015 0547   GLUCOSE 173* 06/14/2015 0547   BUN 16 06/14/2015 0547   CREATININE 0.72 06/14/2015 0547   CREATININE 1.08 06/26/2013 1047   CALCIUM 8.5* 06/14/2015 0547   PROT 8.2* 06/11/2015 2052   ALBUMIN 3.7 06/11/2015 2052   AST 18 06/11/2015 2052   ALT 15* 06/11/2015 2052   ALKPHOS 103 06/11/2015 2052   BILITOT  0.3 06/11/2015 2052   GFRNONAA >60 06/14/2015 0547   GFRAA >60 06/14/2015 0547       Problem List:  Patient Active Problem List   Diagnosis Date Noted  . Palliative care encounter   . Advance care planning   . DNR (do not resuscitate) discussion   . Weakness generalized 06/12/2015  . Pressure ulcer 06/12/2015  . Generalized weakness   . Metastatic cancer to brain ([place])   . H/O noncompliance with medical treatment, presenting hazards to health 06/02/2015  . SIRS (systemic inflammatory response syndrome) ([place]) 05/27/2015  . SOB (shortness of breath)   . Aspiration pneumonia ([place])   . 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 Acute renal insufficiency  05/16/2015  . Hypokalemia 05/16/2015  . Status epilepticus ([place]) 05/16/2015  . Metabolic acidosis 30/86/5784  . Acute encephalopathy 05/16/2015  . Seizure ([place]) 05/12/2015  . Brain metastases ([place]) 05/12/2015  . Vasogenic brain edema ([place]) 05/12/2015  . 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 Seizure disorder ([place]) 11/11/2014  . Abdominal pain, epigastric 05/09/2013  . Abdominal pain, other specified site 05/31/2012  . Rectal bleeding 05/31/2012  . Diarrhea 05/31/2012  . CVA (cerebral infarction) 11/20/2010  . Type 2 diabetes mellitus without complication ([place]) 69/62/9528  . Hyperlipidemia 11/20/2010  . Tobacco abuse 11/20/2010  . ALCOHOL ABUSE 06/06/2009  . COLONIC POLYPS, ADENOMATOUS, HX OF 06/06/2009  . HELICOBACTER PYLORI GASTRITIS, HX OF 06/06/2009  . GERD 08/30/2008  . GROSS HEMATURIA 08/30/2008  . NOSEBLEED 08/30/2008  . Hemiplegia ([place]) 08/03/2008  . Essential hypertension 08/03/2008  . Acute pancreatitis 08/03/2008  . SEIZURE DISORDER 08/03/2008     Palliative Care Assessment & Plan    1.Code Status:  DNR    Code Status Orders        Start     Ordered   06/13/15 1940  Do not attempt resuscitation (DNR)   Continuous    Question Answer Comment  In the event of cardiac or respiratory ARREST Do not call a "code blue"   In the event of cardiac or respiratory ARREST Do not perform Intubation, CPR, defibrillation or ACLS   In the event of cardiac or respiratory ARREST Use medication by any route, position, wound care, and other measures to relive pain and suffering. May use oxygen, suction and manual treatment of airway obstruction as needed for comfort.      06/13/15 1939    Code Status History    Date Active Date Inactive Code Status Order ID Comments User Context   06/12/2015  4:16 AM 06/13/2015  7:39 PM Full Code [id number]  Orvan Falconer, MD Inpatient   05/27/2015  5:47 AM 05/30/2015  5:58 PM Full Code [id number]  Oswald Hillock, MD  Inpatient   05/16/2015 12:17 PM 05/19/2015 12:05 AM Full Code [id number]  Coralie Common, MD Inpatient   05/12/2015  7:16 PM 05/14/2015  6:44 PM Full Code [id number]  Norval Morton, MD Inpatient   11/16/2014 11:33 PM 11/20/2014  4:37 PM Full Code [id number]  Cristal Ford, DO Inpatient   11/10/2014  5:31 AM 11/12/2014  3:41 PM Full Code [id number]  Oswald Hillock, MD Inpatient   11/20/2010  4:24 PM 11/22/2010  3:16 PM Full Code [id number]  Celine Gerome Apley, RN Inpatient       2. Goals of Care/Additional Recommendations:  treat the treatable. We discussed his appointment with radiation oncologist Monday, March 6. Mr. Cuadra tells me that his caseworker, Bren Steers, has transportation  set up for him to go to this appointment. We discussed this treatment may be palliative, but will not be curative. We talk about radiation to control symptoms.  Limitations on Scope of Treatment: None at this time.  Desire for further Chaplaincy support:no  Psycho-social Needs: Education on Hospice  3. Symptom Management:      1. Per hospitalist.  4. Palliative Prophylaxis:   None at this time.  5. Prognosis: Unable to determine, based on outcomes. Anticipated prognosis 6 months or less based on metastatic burden.  6. Discharge Planning:  Home with Home Health, we discussed home with hospice, but Mr. Heid, at this time, wants to have visit with radiation oncologist.   Care plan was discussed with nursing staff, case manager, social worker, and hospitalist on next rounds.  Thank you for allowing the Palliative Medicine Team to assist in the care of this patient.   Time In: 0915 Time Out: 0950 Total Time 35 minutes Prolonged Time Billed  no         Drue Novel, NP  06/14/2015, 12:48 PM  Please contact Palliative Medicine Team phone at [phone] for questions and concerns.

## 2015-06-14 NOTE — Progress Notes (Signed)
[place] Clinical Social Work  Clinical Social Work had arranged transportation for upcoming appointments next week at [facility]. CSW had spoken with Mr Mcanany' caseworker, Dannel Rafter [phone]) through DSS to assist in coordinating transportation to appointments. Mr Doree Fudge actually transported pt to [facility] after last hospitalization, but appt on 2/17 was cancelled as team thought pt would still be in the hospital on this date.   This CSW had discussed with Mr Doree Fudge the possibility for more assistance in the home for this pt and at that time, (2/24), Mr Doree Fudge was going to explore CAP services. However, pt cannot have CAP and hospice services at the same time. Pt does have medicaid that can provide aide through personal care services PCS, through the state medicaid program. This can be put in place within 48 hours after admission to  hospital, if team is willing to make referral to PCS/ [facility]. [email or web address]  Pt may need placement, if family cannot provide 24/7 care.   This CSW needs guidance if upcoming appointments will be cancelled or rescheduled in order to change transportation. Inpt CSW will need to assist with dispo, as this CSW working from [facility] and following along.    Clinical Social Work interventions: Resource assistance   Loren Racer, [place] Worker [place]  [place] Phone: [phone] Fax: [phone]   Loren Racer, [place] Tuesdays   [phone]
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
## 2015-06-14 NOTE — Progress Notes (Signed)
OT Cancellation Note  Patient Details Name: [name] MRN: [id number] DOB: 1960/11/03   Cancelled Treatment:     Reason evaluation not completed: Chart reviewed. Pt is planning to discharge home with hospice services, provided family is able to provide 24/7 care as pt condition is declining and total care may be warranted. Please reorder OT services if this plan changes and evaluation is necessary.   Guadelupe Sabin, OTR/L  [phone]  06/14/2015, 8:13 AM

## 2015-06-14 NOTE — Care Management Important Message (Signed)
Important Message  Patient Details  Name: [name] MRN: [id number] Date of Birth: Apr 27, 1960   Medicare Important Message Given:  Yes    Sherald Barge, RN 06/14/2015, 1:52 PM

## 2015-06-15 ENCOUNTER — Inpatient Hospital Stay (HOSPITAL_COMMUNITY)
Admission: EM | Admit: 2015-06-15 | Discharge: 2015-06-18 | DRG: 682 | Disposition: A | Discharge status: Home Health | Payer: Medicare Other | Attending: Internal Medicine | Admitting: Internal Medicine

## 2015-06-15 ENCOUNTER — Encounter (HOSPITAL_COMMUNITY): Payer: Self-pay | Admitting: Emergency Medicine

## 2015-06-15 DIAGNOSIS — D899 Disorder involving the immune mechanism, unspecified: Secondary | ICD-10-CM | POA: Diagnosis present

## 2015-06-15 DIAGNOSIS — G936 Cerebral edema: Secondary | ICD-10-CM | POA: Diagnosis present

## 2015-06-15 DIAGNOSIS — G40409 Other generalized epilepsy and epileptic syndromes, not intractable, without status epilepticus: Secondary | ICD-10-CM | POA: Diagnosis not present

## 2015-06-15 DIAGNOSIS — Z66 Do not resuscitate: Secondary | ICD-10-CM | POA: Diagnosis not present

## 2015-06-15 DIAGNOSIS — R627 Adult failure to thrive: Secondary | ICD-10-CM | POA: Diagnosis present

## 2015-06-15 DIAGNOSIS — Z7984 Long term (current) use of oral hypoglycemic drugs: Secondary | ICD-10-CM

## 2015-06-15 DIAGNOSIS — G8191 Hemiplegia, unspecified affecting right dominant side: Secondary | ICD-10-CM | POA: Diagnosis not present

## 2015-06-15 DIAGNOSIS — K861 Other chronic pancreatitis: Secondary | ICD-10-CM | POA: Diagnosis not present

## 2015-06-15 DIAGNOSIS — Z720 Tobacco use: Secondary | ICD-10-CM | POA: Diagnosis present

## 2015-06-15 DIAGNOSIS — F17210 Nicotine dependence, cigarettes, uncomplicated: Secondary | ICD-10-CM | POA: Diagnosis present

## 2015-06-15 DIAGNOSIS — C7931 Secondary malignant neoplasm of brain: Secondary | ICD-10-CM | POA: Diagnosis present

## 2015-06-15 DIAGNOSIS — G40309 Generalized idiopathic epilepsy and epileptic syndromes, not intractable, without status epilepticus: Secondary | ICD-10-CM | POA: Diagnosis present

## 2015-06-15 DIAGNOSIS — I10 Essential (primary) hypertension: Secondary | ICD-10-CM | POA: Diagnosis present

## 2015-06-15 DIAGNOSIS — E119 Type 2 diabetes mellitus without complications: Secondary | ICD-10-CM | POA: Diagnosis not present

## 2015-06-15 DIAGNOSIS — Z9119 Patient's noncompliance with other medical treatment and regimen: Secondary | ICD-10-CM

## 2015-06-15 DIAGNOSIS — E7800 Pure hypercholesterolemia, unspecified: Secondary | ICD-10-CM | POA: Diagnosis present

## 2015-06-15 DIAGNOSIS — C3491 Malignant neoplasm of unspecified part of right bronchus or lung: Secondary | ICD-10-CM | POA: Diagnosis present

## 2015-06-15 DIAGNOSIS — G8190 Hemiplegia, unspecified affecting unspecified side: Secondary | ICD-10-CM

## 2015-06-15 DIAGNOSIS — Z809 Family history of malignant neoplasm, unspecified: Secondary | ICD-10-CM | POA: Diagnosis not present

## 2015-06-15 DIAGNOSIS — Z91199 Patient's noncompliance with other medical treatment and regimen due to unspecified reason: Secondary | ICD-10-CM

## 2015-06-15 DIAGNOSIS — N179 Acute kidney failure, unspecified: Principal | ICD-10-CM | POA: Diagnosis present

## 2015-06-15 LAB — CBC WITH DIFFERENTIAL/PLATELET
BASOS ABS: 0.1 10*3/uL (ref 0.0–0.1)
BASOS PCT: 1 %
EOS ABS: 0.2 10*3/uL (ref 0.0–0.7)
Eosinophils Relative: 2 %
HEMATOCRIT: 43.3 % (ref 39.0–52.0)
HEMOGLOBIN: 14.2 g/dL (ref 13.0–17.0)
Lymphocytes Relative: 21 %
Lymphs Abs: 2.4 10*3/uL (ref 0.7–4.0)
MCH: 29 pg (ref 26.0–34.0)
MCHC: 32.8 g/dL (ref 30.0–36.0)
MCV: 88.5 fL (ref 78.0–100.0)
Monocytes Absolute: 0.9 10*3/uL (ref 0.1–1.0)
Monocytes Relative: 8 %
NEUTROS ABS: 7.9 10*3/uL — AB (ref 1.7–7.7)
Neutrophils Relative %: 68 %
Platelets: 199 10*3/uL (ref 150–400)
RBC: 4.89 MIL/uL (ref 4.22–5.81)
RDW: 14.5 % (ref 11.5–15.5)
WBC: 11.4 10*3/uL — ABNORMAL HIGH (ref 4.0–10.5)

## 2015-06-15 LAB — GLUCOSE, CAPILLARY: Glucose-Capillary: 138 mg/dL — ABNORMAL HIGH (ref 65–99)

## 2015-06-15 LAB — BASIC METABOLIC PANEL
Anion gap: 9 (ref 5–15)
BUN: 35 mg/dL — AB (ref 6–20)
CHLORIDE: 103 mmol/L (ref 101–111)
CO2: 24 mmol/L (ref 22–32)
Calcium: 8.5 mg/dL — ABNORMAL LOW (ref 8.9–10.3)
Creatinine, Ser: 1.41 mg/dL — ABNORMAL HIGH (ref 0.61–1.24)
GFR calc Af Amer: >60 mL/min (ref >60)
GFR calc non Af Amer: 54 mL/min — ABNORMAL LOW (ref >60)
GLUCOSE: 178 mg/dL — AB (ref 65–99)
POTASSIUM: 3.8 mmol/L (ref 3.5–5.1)
Sodium: 136 mmol/L (ref 135–145)

## 2015-06-15 MED ORDER — DEXAMETHASONE 4 MG PO TABS
4.0000 mg | ORAL_TABLET | Freq: Four times a day (QID) | ORAL | Status: DC
  Administered 2015-06-15 – 2015-06-18 (×11): 4 mg via ORAL
  Filled 2015-06-15 (×17): qty 1

## 2015-06-15 MED ORDER — CHLORPROMAZINE HCL 25 MG PO TABS
25.0000 mg | ORAL_TABLET | Freq: Three times a day (TID) | ORAL | Status: DC | PRN
  Filled 2015-06-15: qty 1

## 2015-06-15 MED ORDER — ALUM & MAG HYDROXIDE-SIMETH 200-200-20 MG/5ML PO SUSP
30.0000 mL | Freq: Four times a day (QID) | ORAL | Status: DC | PRN
  Filled 2015-06-15: qty 30

## 2015-06-15 MED ORDER — PRAVASTATIN SODIUM 10 MG PO TABS
20.0000 mg | ORAL_TABLET | Freq: Every day | ORAL | Status: DC
  Administered 2015-06-15 – 2015-06-17 (×3): 20 mg via ORAL
  Filled 2015-06-15 (×3): qty 2

## 2015-06-15 MED ORDER — ONDANSETRON HCL 4 MG PO TABS: 4.0000 mg | ORAL_TABLET | Freq: Four times a day (QID) | ORAL | Status: DC | PRN

## 2015-06-15 MED ORDER — HYDROCODONE-ACETAMINOPHEN 5-325 MG PO TABS
1.0000 | ORAL_TABLET | ORAL | Status: DC | PRN
  Administered 2015-06-16: 1 via ORAL
  Filled 2015-06-15: qty 1

## 2015-06-15 MED ORDER — PANTOPRAZOLE SODIUM 40 MG PO TBEC
40.0000 mg | DELAYED_RELEASE_TABLET | Freq: Every day | ORAL | Status: DC
  Administered 2015-06-16 – 2015-06-18 (×3): 40 mg via ORAL
  Filled 2015-06-15 (×4): qty 1

## 2015-06-15 MED ORDER — SODIUM CHLORIDE 0.9 % IV BOLUS (SEPSIS)
1000.0000 mL | Freq: Once | INTRAVENOUS | Status: AC
  Administered 2015-06-15: 1000 mL via INTRAVENOUS

## 2015-06-15 MED ORDER — PANCRELIPASE (LIP-PROT-AMYL) 36000-114000 UNITS PO CPEP
36000.0000 [IU] | ORAL_CAPSULE | Freq: Two times a day (BID) | ORAL | Status: DC
  Administered 2015-06-15 – 2015-06-16 (×3): 36000 [IU] via ORAL
  Filled 2015-06-15 (×3): qty 1

## 2015-06-15 MED ORDER — PHENOBARBITAL 97.2 MG PO TABS
97.2000 mg | ORAL_TABLET | Freq: Two times a day (BID) | ORAL | Status: DC
  Administered 2015-06-15 – 2015-06-18 (×6): 97.2 mg via ORAL
  Filled 2015-06-15 (×6): qty 1

## 2015-06-15 MED ORDER — ENOXAPARIN SODIUM 40 MG/0.4ML ~~LOC~~ SOLN
40.0000 mg | SUBCUTANEOUS | Status: DC
  Administered 2015-06-15 – 2015-06-17 (×3): 40 mg via SUBCUTANEOUS
  Filled 2015-06-15 (×3): qty 0.4

## 2015-06-15 MED ORDER — ACETAMINOPHEN 650 MG RE SUPP: 650.0000 mg | Freq: Four times a day (QID) | RECTAL | Status: DC | PRN

## 2015-06-15 MED ORDER — ACETAMINOPHEN 325 MG PO TABS: 650.0000 mg | ORAL_TABLET | Freq: Four times a day (QID) | ORAL | Status: DC | PRN

## 2015-06-15 MED ORDER — INSULIN ASPART 100 UNIT/ML ~~LOC~~ SOLN
0.0000 [IU] | Freq: Three times a day (TID) | SUBCUTANEOUS | Status: DC
  Administered 2015-06-16: 5 [IU] via SUBCUTANEOUS
  Administered 2015-06-16: 3 [IU] via SUBCUTANEOUS
  Administered 2015-06-16 – 2015-06-17 (×4): 2 [IU] via SUBCUTANEOUS
  Administered 2015-06-18: 4 [IU] via SUBCUTANEOUS
  Administered 2015-06-18: 3 [IU] via SUBCUTANEOUS

## 2015-06-15 MED ORDER — SODIUM CHLORIDE 0.9 % IV SOLN
INTRAVENOUS | Status: AC
Start: 2015-06-15 — End: 2015-06-16
  Administered 2015-06-15: 21:00:00 via INTRAVENOUS

## 2015-06-15 MED ORDER — POTASSIUM CHLORIDE CRYS ER 10 MEQ PO TBCR
10.0000 meq | EXTENDED_RELEASE_TABLET | Freq: Every day | ORAL | Status: DC
  Administered 2015-06-16 – 2015-06-18 (×3): 10 meq via ORAL
  Filled 2015-06-15 (×6): qty 1

## 2015-06-15 MED ORDER — INSULIN ASPART 100 UNIT/ML ~~LOC~~ SOLN
4.0000 [IU] | Freq: Three times a day (TID) | SUBCUTANEOUS | Status: DC
  Administered 2015-06-16 – 2015-06-18 (×7): 4 [IU] via SUBCUTANEOUS

## 2015-06-15 MED ORDER — CHLORPROMAZINE HCL 25 MG PO TABS
100.0000 mg | ORAL_TABLET | Freq: Once | ORAL | Status: AC
  Administered 2015-06-15: 100 mg via ORAL
  Filled 2015-06-15: qty 4

## 2015-06-15 MED ORDER — INSULIN ASPART 100 UNIT/ML ~~LOC~~ SOLN
0.0000 [IU] | Freq: Every day | SUBCUTANEOUS | Status: DC
  Administered 2015-06-16: 2 [IU] via SUBCUTANEOUS

## 2015-06-15 MED ORDER — PANCRELIPASE (LIP-PROT-AMYL) 36000-114000 UNITS PO CPEP
108000.0000 [IU] | ORAL_CAPSULE | Freq: Three times a day (TID) | ORAL | Status: DC
  Administered 2015-06-16 – 2015-06-18 (×8): 108000 [IU] via ORAL
  Filled 2015-06-15 (×8): qty 3

## 2015-06-15 MED ORDER — LEVETIRACETAM 500 MG PO TABS
2000.0000 mg | ORAL_TABLET | Freq: Two times a day (BID) | ORAL | Status: DC
Start: 2015-06-15 — End: 2015-06-18
  Administered 2015-06-15 – 2015-06-18 (×6): 2000 mg via ORAL
  Filled 2015-06-15 (×6): qty 4

## 2015-06-15 MED ORDER — ONDANSETRON HCL 4 MG/2ML IJ SOLN
4.0000 mg | Freq: Four times a day (QID) | INTRAMUSCULAR | Status: DC | PRN
  Administered 2015-06-15: 4 mg via INTRAVENOUS
  Filled 2015-06-15: qty 2
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
## 2015-06-15 NOTE — ED Notes (Signed)
Patient discharged yesterday from hospital. Patient is to have around the clock care for hospice to see patient. Family unable to provide around the clock care. Family told to bring patient back to hospital by hospice. Patient denies any nausea, vomiting, diarrhea, fevers, or pain. Per patient fatigued. 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 Patient to start radiation on Monday.
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
## 2015-06-16 ENCOUNTER — Encounter (HOSPITAL_COMMUNITY): Payer: Self-pay

## 2015-06-16 DIAGNOSIS — Z91199 Patient's noncompliance with other medical treatment and regimen due to unspecified reason: Secondary | ICD-10-CM

## 2015-06-16 DIAGNOSIS — G40309 Generalized idiopathic epilepsy and epileptic syndromes, not intractable, without status epilepticus: Secondary | ICD-10-CM | POA: Diagnosis not present

## 2015-06-16 DIAGNOSIS — G936 Cerebral edema: Secondary | ICD-10-CM

## 2015-06-16 DIAGNOSIS — C7931 Secondary malignant neoplasm of brain: Secondary | ICD-10-CM | POA: Diagnosis not present

## 2015-06-16 DIAGNOSIS — Z9119 Patient's noncompliance with other medical treatment and regimen: Secondary | ICD-10-CM

## 2015-06-16 DIAGNOSIS — N179 Acute kidney failure, unspecified: Secondary | ICD-10-CM | POA: Diagnosis not present

## 2015-06-16 DIAGNOSIS — R627 Adult failure to thrive: Secondary | ICD-10-CM | POA: Diagnosis not present

## 2015-06-16 LAB — GLUCOSE, CAPILLARY
GLUCOSE-CAPILLARY: 123 mg/dL — AB (ref 65–99)
GLUCOSE-CAPILLARY: 191 mg/dL — AB (ref 65–99)
GLUCOSE-CAPILLARY: 242 mg/dL — AB (ref 65–99)
GLUCOSE-CAPILLARY: 215 mg/dL — AB (ref 65–99)

## 2015-06-16 LAB — BASIC METABOLIC PANEL
Anion gap: 8 (ref 5–15)
BUN: 19 mg/dL (ref 6–20)
CO2: 24 mmol/L (ref 22–32)
CREATININE: 0.82 mg/dL (ref 0.61–1.24)
Calcium: 8.3 mg/dL — ABNORMAL LOW (ref 8.9–10.3)
Chloride: 104 mmol/L (ref 101–111)
GFR calc Af Amer: >60 mL/min (ref >60)
Glucose, Bld: 147 mg/dL — ABNORMAL HIGH (ref 65–99)
POTASSIUM: 4.4 mmol/L (ref 3.5–5.1)
SODIUM: 136 mmol/L (ref 135–145)

## 2015-06-16 MED ORDER — CHLORPROMAZINE HCL 10 MG PO TABS
10.0000 mg | ORAL_TABLET | Freq: Three times a day (TID) | ORAL | Status: DC | PRN
Start: 2015-06-16 — End: 2015-06-18
  Administered 2015-06-16 (×2): 10 mg via ORAL
  Administered 2015-06-17: 20 mg via ORAL
  Administered 2015-06-17: 10 mg via ORAL
  Administered 2015-06-18 (×2): 20 mg via ORAL
  Filled 2015-06-16 (×7): qty 2

## 2015-06-16 MED ORDER — LEVETIRACETAM 500 MG PO TABS
1500.0000 mg | ORAL_TABLET | Freq: Once | ORAL | Status: AC
  Filled 2015-06-16: qty 3
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
## 2015-06-17 ENCOUNTER — Ambulatory Visit: Payer: Medicare Other

## 2015-06-17 ENCOUNTER — Telehealth: Payer: Self-pay | Admitting: Radiation Oncology

## 2015-06-17 ENCOUNTER — Ambulatory Visit
Admission: RE | Admit: 2015-06-17 | Discharge: 2015-06-17 | Disposition: A | Discharge status: Home / Self Care | Payer: Medicare Other | Source: Ambulatory Visit | Attending: Radiation Oncology | Admitting: Radiation Oncology

## 2015-06-17 DIAGNOSIS — C7931 Secondary malignant neoplasm of brain: Secondary | ICD-10-CM | POA: Diagnosis not present

## 2015-06-17 DIAGNOSIS — C3491 Malignant neoplasm of unspecified part of right bronchus or lung: Secondary | ICD-10-CM | POA: Diagnosis not present

## 2015-06-17 DIAGNOSIS — N179 Acute kidney failure, unspecified: Secondary | ICD-10-CM | POA: Diagnosis not present

## 2015-06-17 DIAGNOSIS — Z515 Encounter for palliative care: Secondary | ICD-10-CM | POA: Diagnosis not present

## 2015-06-17 DIAGNOSIS — G8190 Hemiplegia, unspecified affecting unspecified side: Secondary | ICD-10-CM

## 2015-06-17 DIAGNOSIS — R627 Adult failure to thrive: Secondary | ICD-10-CM

## 2015-06-17 DIAGNOSIS — G40309 Generalized idiopathic epilepsy and epileptic syndromes, not intractable, without status epilepticus: Secondary | ICD-10-CM | POA: Diagnosis not present

## 2015-06-17 LAB — BASIC METABOLIC PANEL
Anion gap: 9 (ref 5–15)
BUN: 15 mg/dL (ref 6–20)
CALCIUM: 8.7 mg/dL — AB (ref 8.9–10.3)
CHLORIDE: 99 mmol/L — AB (ref 101–111)
CO2: 28 mmol/L (ref 22–32)
CREATININE: 0.71 mg/dL (ref 0.61–1.24)
Glucose, Bld: 161 mg/dL — ABNORMAL HIGH (ref 65–99)
Potassium: 3.8 mmol/L (ref 3.5–5.1)
SODIUM: 136 mmol/L (ref 135–145)

## 2015-06-17 LAB — GLUCOSE, CAPILLARY
GLUCOSE-CAPILLARY: 130 mg/dL — AB (ref 65–99)
Glucose-Capillary: 135 mg/dL — ABNORMAL HIGH (ref 65–99)
Glucose-Capillary: 142 mg/dL — ABNORMAL HIGH (ref 65–99)
Glucose-Capillary: 174 mg/dL — ABNORMAL HIGH (ref 65–99)

## 2015-06-17 LAB — CBC
HCT: 42.5 % (ref 39.0–52.0)
HEMOGLOBIN: 14.1 g/dL (ref 13.0–17.0)
MCH: 29 pg (ref 26.0–34.0)
MCHC: 33.2 g/dL (ref 30.0–36.0)
MCV: 87.3 fL (ref 78.0–100.0)
PLATELETS: 212 10*3/uL (ref 150–400)
RBC: 4.87 MIL/uL (ref 4.22–5.81)
RDW: 14.2 % (ref 11.5–15.5)
WBC: 11.8 10*3/uL — ABNORMAL HIGH (ref 4.0–10.5)

## 2015-06-17 NOTE — Progress Notes (Signed)
Inpatient Diabetes Program Recommendations  AACE/ADA: New Consensus Statement on Inpatient Glycemic Control (2015)  Target Ranges:  Prepandial:   less than 140 mg/dL      Peak postprandial:   less than 180 mg/dL (1-2 hours)      Critically ill patients:  140 - 180 mg/dL   Results for [name], [name] (MRN [id number]) as of 06/17/2015 11:38  Ref. Range 06/16/2015 07:32 06/16/2015 11:27 06/16/2015 17:01 06/16/2015 20:27  Glucose-Capillary Latest Ref Range: 65-99 mg/dL 123 (H) 191 (H) 242 (H) 215 (H)    Admit with: Weakness  History: DM, Lung Cancer with Mets  Home DM Meds: Invokana 100 mg daily       Metformin 1000 mg bid  Current Insulin Orders: Novolog Moderate Correction Scale/ SSI (0-15 units) TID AC + HS      Novolog 4 units tidwc      MD- If within goals of care for this patient, please consider increasing Novolog Meal Coverage to 6 units tidwc (currently ordered as Novolog 4 units tidwc) while patient getting Decadron      --Will follow patient during hospitalization--  Wyn Quaker RN, MSN, CDE Diabetes Coordinator Inpatient Glycemic Control Team Team Pager: [phone] (8a-5p)

## 2015-06-17 NOTE — Telephone Encounter (Signed)
Phoned patient's cell. No answer. Left message. Phoned sister's cell. No answer. Left message. Phoned patient's niece and she reports her uncle is hospitalized at [facility]. Informed Dr. [name] of this finding.

## 2015-06-17 NOTE — Progress Notes (Signed)
Daily Progress Note   Patient Name: [name]       Date: 06/17/2015 DOB: [birth year]  Age: 55 y.o. MRN#: [id number] Attending Physician: Rexene Alberts, MD Primary Care Physician: [name], MD Admit Date: 06/15/2015  Reason for Consultation/Follow-up: Disposition, Establishing goals of care, Hospice Evaluation and Psychosocial/spiritual support  Subjective: [name] is sitting up in bed he makes eye contact and greets me as I enter. At his bedside are sister Levester Fresh, and her stepson. He states, "God does work in miraculous ways", and tells me he was just thinking about/trying to get in touch with me.  He states that his sister Barbaraann Share has agreed to take him into her home. Barbaraann Share agrees, stating that she has room for him. He states "I realize I don't have much time". He continues to decline hospice services at home. He shares that he wants to be surrounded by family during this time. I talk with them about accepting hospice services when the time is right, when he has continued functional decline, or there is no further treatment offered. [name] states that he will not disagree with Barbaraann Share when she says that it is time.  We talk about the importance of working with the radiation oncologist, Dr. [name]. I share that he was to have an appointment today with Dr. [name] and that it is important for Louise to call and let the office know that he will likely not make this appointment due to hospitalization. I encourage them to make a repeat appointment as soon as possible. Barbaraann Share is calling Dr. [name] office as I leave the room. Case manager made away or of [name] desire to move in with his sister Barbaraann Share at [address].     Length of Stay: 2 days  Current  Medications: Scheduled Meds:  . dexamethasone  4 mg Oral QID  . enoxaparin (LOVENOX) injection  40 mg Subcutaneous Q24H  . insulin aspart  0-15 Units Subcutaneous TID WC  . insulin aspart  0-5 Units Subcutaneous QHS  . insulin aspart  4 Units Subcutaneous TID WC  . levETIRAcetam  2,000 mg Oral BID  . lipase/protease/amylase  108,000 Units Oral TID WC  . lipase/protease/amylase  36,000 Units Oral BID  . pantoprazole  40 mg Oral Daily  . PHENobarbital  97.2 mg  Oral BID  . potassium chloride  10 mEq Oral Daily  . pravastatin  20 mg Oral QHS    Continuous Infusions:    PRN Meds: acetaminophen **OR** acetaminophen, alum & mag hydroxide-simeth, chlorproMAZINE, HYDROcodone-acetaminophen, ondansetron **OR** ondansetron (ZOFRAN) IV  Physical Exam: Physical Exam  Constitutional: He is oriented to person, place, and time. No distress.  Pulmonary/Chest: Effort normal. No respiratory distress.  Abdominal: Soft. He exhibits no distension.  Neurological: He is alert and oriented to person, place, and time.  Skin: Skin is warm.  Nursing note and vitals reviewed.               Vital Signs: BP 118/67 mmHg  Pulse 67  Temp(Src) 98.5 F (36.9 C) (Oral)  Resp 16  Ht '6\' 2"'$  (1.88 m)  Wt 88.134 kg (194 lb 4.8 oz)  BMI 24.94 kg/m2  SpO2 97% SpO2: SpO2: 97 % O2 Device: O2 Device: Not Delivered O2 Flow Rate:    Intake/output summary:  Intake/Output Summary (Last 24 hours) at 06/17/15 1254 Last data filed at 06/17/15 0830  Gross per 24 hour  Intake    240 ml  Output   2200 ml  Net  -1960 ml   LBM: Last BM Date: 06/16/15 Baseline Weight: Weight: 88.134 kg (194 lb 4.8 oz) Most recent weight: Weight: 88.134 kg (194 lb 4.8 oz)       Palliative Assessment/Data:   Additional Data Reviewed: CBC    Component Value Date/Time   WBC 11.8* 06/17/2015 0613   RBC 4.87 06/17/2015 0613   HGB 14.1 06/17/2015 0613   HCT 42.5 06/17/2015 0613   PLT 212 06/17/2015 0613   MCV 87.3 06/17/2015 0613    MCH 29.0 06/17/2015 0613   MCHC 33.2 06/17/2015 0613   RDW 14.2 06/17/2015 0613   LYMPHSABS 2.4 06/15/2015 1515   MONOABS 0.9 06/15/2015 1515   EOSABS 0.2 06/15/2015 1515   BASOSABS 0.1 06/15/2015 1515    CMP     Component Value Date/Time   NA 136 06/17/2015 0613   K 3.8 06/17/2015 0613   CL 99* 06/17/2015 0613   CO2 28 06/17/2015 0613   GLUCOSE 161* 06/17/2015 0613   BUN 15 06/17/2015 0613   CREATININE 0.71 06/17/2015 0613   CREATININE 1.08 06/26/2013 1047   CALCIUM 8.7* 06/17/2015 0613   PROT 8.2* 06/11/2015 2052   ALBUMIN 3.7 06/11/2015 2052   AST 18 06/11/2015 2052   ALT 15* 06/11/2015 2052   ALKPHOS 103 06/11/2015 2052   BILITOT 0.3 06/11/2015 2052   GFRNONAA >60 06/17/2015 0613   GFRAA >60 06/17/2015 6378       Problem List:  Patient Active Problem List   Diagnosis Date Noted  . Adult failure to thrive 06/16/2015  . Brain metastasis ([place]) 06/16/2015  . 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 Non-compliance 06/16/2015  . AKI (acute kidney injury) ([place]) 06/15/2015  . Palliative care encounter   . Advance care planning   . DNR (do not resuscitate) discussion   . Weakness generalized 06/12/2015  . Pressure ulcer 06/12/2015  . Generalized weakness   . Metastatic cancer to brain ([place])   . H/O noncompliance with medical treatment, presenting hazards to health 06/02/2015  . SIRS (systemic inflammatory response syndrome) ([place]) 05/27/2015  . SOB (shortness of breath)   . Aspiration pneumonia ([place])   . Epilepsy with status epilepticus ([place]) 05/16/2015  . Acute renal insufficiency 05/16/2015  . Hypokalemia 05/16/2015  . Status epilepticus ([place]) 05/16/2015  . Metabolic acidosis 58/85/0277  .  Acute encephalopathy 05/16/2015  . Seizure ([place]) 05/12/2015  . Brain metastases ([place]) 05/12/2015  . Vasogenic brain edema ([place]) 05/12/2015  . 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 Seizure disorder ([place]) 11/11/2014  .  Abdominal pain, epigastric 05/09/2013  . Abdominal pain, other specified site 05/31/2012  . Rectal bleeding 05/31/2012  . Diarrhea 05/31/2012  . CVA (cerebral infarction) 11/20/2010  . Type 2 diabetes mellitus without complication ([place]) 66/09/3014  . Hyperlipidemia 11/20/2010  . Tobacco abuse 11/20/2010  . ALCOHOL ABUSE 06/06/2009  . COLONIC POLYPS, ADENOMATOUS, HX OF 06/06/2009  . HELICOBACTER PYLORI GASTRITIS, HX OF 06/06/2009  . GERD 08/30/2008  . GROSS HEMATURIA 08/30/2008  . NOSEBLEED 08/30/2008  . Hemiplegia ([place]) 08/03/2008  . Essential hypertension 08/03/2008  . Acute pancreatitis 08/03/2008  . SEIZURE DISORDER 08/03/2008     Palliative Care Assessment & Plan    1.Code Status:  DNR    Code Status Orders        Start     Ordered   06/15/15 1941  Do not attempt resuscitation (DNR)   Continuous    Question Answer Comment  In the event of cardiac or respiratory ARREST Do not call a "code blue"   In the event of cardiac or respiratory ARREST Do not perform Intubation, CPR, defibrillation or ACLS   In the event of cardiac or respiratory ARREST Use medication by any route, position, wound care, and other measures to relive pain and suffering. May use oxygen, suction and manual treatment of airway obstruction as needed for comfort.      06/15/15 1940    Code Status History    Date Active Date Inactive Code Status Order ID Comments User Context   06/13/2015  7:39 PM 06/14/2015  3:25 PM DNR [id number]  Drue Novel, NP Inpatient   06/12/2015  4:16 AM 06/13/2015  7:39 PM Full Code [id number]  Orvan Falconer, MD Inpatient   05/27/2015  5:47 AM 05/30/2015  5:58 PM Full Code [id number]  Oswald Hillock, MD Inpatient   05/16/2015 12:17 PM 05/19/2015 12:05 AM Full Code [id number]  Coralie Common, MD Inpatient   05/12/2015  7:16 PM 05/14/2015  6:44 PM Full Code [id number]  Norval Morton, MD Inpatient   11/16/2014 11:33 PM 11/20/2014  4:37 PM Full Code [id number]  Cristal Ford, DO Inpatient   11/10/2014   5:31 AM 11/12/2014  3:41 PM Full Code [id number]  Oswald Hillock, MD Inpatient   11/20/2010  4:24 PM 11/22/2010  3:16 PM Full Code [id number]  Celine Gerome Apley, RN Inpatient       2. Goals of Care/Additional Recommendations:  at this point [name] is still interested in seeking palliative, but not curative, radiation treatment.   Limitations on Scope of Treatment: Treat the treatable  Desire for further Chaplaincy support:no  Psycho-social Needs: Education on Hospice  3. Symptom Management:      1. Per hospitalist.  4. Palliative Prophylaxis:   None at this time.  5. Prognosis: Unable to determine, 6 months or less likely due to metastatic cancer burden.  6. Discharge Planning:  Home with Home Health, [name] continues to decline hospice services at this time. He shares that his goal is to be surrounded by family as much as possible. His sister Donell Sievert has agreed to take him into her home.   Care plan was discussed with nursing staff, case manager, social worker, and Dr. Caryn Section on next rounds.  Thank you for allowing  the Palliative Medicine Team to assist in the care of this patient.   Time In:   1105 Time Out: 1140 Total Time 35 minutes Prolonged Time Billed  no         Drue Novel, NP  06/17/2015, 12:54 PM  Please contact Palliative Medicine Team phone at [phone] for questions and concerns.
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
## 2015-06-18 ENCOUNTER — Telehealth: Payer: Self-pay | Admitting: Radiation Oncology

## 2015-06-18 DIAGNOSIS — C7931 Secondary malignant neoplasm of brain: Secondary | ICD-10-CM | POA: Diagnosis not present

## 2015-06-18 DIAGNOSIS — Z720 Tobacco use: Secondary | ICD-10-CM

## 2015-06-18 DIAGNOSIS — R627 Adult failure to thrive: Secondary | ICD-10-CM | POA: Diagnosis not present

## 2015-06-18 DIAGNOSIS — G40309 Generalized idiopathic epilepsy and epileptic syndromes, not intractable, without status epilepticus: Secondary | ICD-10-CM | POA: Diagnosis not present

## 2015-06-18 DIAGNOSIS — N179 Acute kidney failure, unspecified: Secondary | ICD-10-CM | POA: Diagnosis not present

## 2015-06-18 LAB — BASIC METABOLIC PANEL
Anion gap: 8 (ref 5–15)
BUN: 15 mg/dL (ref 6–20)
CALCIUM: 8.7 mg/dL — AB (ref 8.9–10.3)
CO2: 28 mmol/L (ref 22–32)
Chloride: 99 mmol/L — ABNORMAL LOW (ref 101–111)
Creatinine, Ser: 0.62 mg/dL (ref 0.61–1.24)
GFR calc Af Amer: >60 mL/min (ref >60)
GLUCOSE: 183 mg/dL — AB (ref 65–99)
Potassium: 4 mmol/L (ref 3.5–5.1)
Sodium: 135 mmol/L (ref 135–145)

## 2015-06-18 LAB — HEMOGLOBIN A1C
Hgb A1c MFr Bld: 7.2 % — ABNORMAL HIGH (ref 4.8–5.6)
Mean Plasma Glucose: 160 mg/dL

## 2015-06-18 LAB — GLUCOSE, CAPILLARY
Glucose-Capillary: 200 mg/dL — ABNORMAL HIGH (ref 65–99)
Glucose-Capillary: 107 mg/dL — ABNORMAL HIGH (ref 65–99)

## 2015-06-18 LAB — CBC
HEMATOCRIT: 43.3 % (ref 39.0–52.0)
Hemoglobin: 14.4 g/dL (ref 13.0–17.0)
MCH: 29.1 pg (ref 26.0–34.0)
MCHC: 33.3 g/dL (ref 30.0–36.0)
MCV: 87.7 fL (ref 78.0–100.0)
PLATELETS: 217 10*3/uL (ref 150–400)
RBC: 4.94 MIL/uL (ref 4.22–5.81)
RDW: 14.4 % (ref 11.5–15.5)
WBC: 10.7 10*3/uL — ABNORMAL HIGH (ref 4.0–10.5)

## 2015-06-18 MED ORDER — LOSARTAN POTASSIUM 100 MG PO TABS: 50.0000 mg | ORAL_TABLET | Freq: Every morning | ORAL | Status: AC
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
## 2015-06-18 NOTE — Telephone Encounter (Signed)
Maclin Guerrette [phone]) called to advise pt was in hospital and to try and reschedule next week for his treatments

## 2015-06-18 NOTE — Care Management Note (Signed)
Case Management Note  Patient Details  Name: [name] MRN: [id number] Date of Birth: 19-Sep-1960  Subjective/Objective:                  Pt is from home alone. Pt's sisters sent pt to ED after not being able to provide care for him in his own home. Hospice has been recommended but pt is not accepting to hospice services at this point. Pt's sister [name] has offered for pt to come stay with her. Pt will cont to receive [facility] services through [facility] and being followed by [facility] services through [facility]. Pt's sister in room for assessment. Family has transported his DME from his home to his sisters.   Action/Plan: Pt discharging home today with resumption of [facility] [facility] services. [name], of [place], made aware of DC. Pt, family made aware HH has 48 hours to resume services. [name], CSW with [facility], made aware of DC home today.   Expected Discharge Date:     06/18/2015             Expected Discharge Plan:  [place]  In-House Referral:  NA  Discharge planning Services  CM Consult  Post Acute Care Choice:  Home Health, Resumption of Svcs/PTA Provider Choice offered to:  Patient  DME Arranged:    DME Agency:     HH Arranged:  RN [place] Agency:  [place]  Status of Service:  Completed, signed off  Medicare Important Message Given:    yes Date Medicare IM Given:    Medicare IM give by:    Date Additional Medicare IM Given:    Additional Medicare Important Message give by:     If discussed at [place] of Stay Meetings, dates discussed:    Additional Comments:  Craig Barge, RN 06/18/2015, 3:28 PM

## 2015-06-18 NOTE — Progress Notes (Signed)
Pt discharged to pt's sister's home today per Dr. Caryn Section.  Pt's IV site D/C'd and WDL. Pt's VSS.  Pt's niece was called and went over discharge paperwork with RN.  Verbalized understanding.  Pt left floor via EMS stretcher in stable condition accompanied by EMS personnel.

## 2015-06-19 ENCOUNTER — Telehealth: Payer: Self-pay | Admitting: *Deleted

## 2015-06-19 NOTE — Telephone Encounter (Signed)
TC received from intake '@hospice'$  of [place] to inquire about appt patient may have for Dr. [name] on Monday. Reviewe appts. No appts noted in system. Pt dfischarged from hospital yesterday and is to follow up with Dr. [name] regarding palliative radiation. Hospice services have not been started yet as they are waiting for report from dr. [name] and treatment plan.

## 2015-06-21 ENCOUNTER — Ambulatory Visit: Payer: Medicare Other | Admitting: Radiation Oncology

## 2015-06-21 ENCOUNTER — Ambulatory Visit: Payer: Medicare Other

## 2015-06-27 ENCOUNTER — Ambulatory Visit: Payer: Medicare Other | Admitting: Radiation Oncology

## 2015-07-01 ENCOUNTER — Ambulatory Visit: Payer: Medicare Other | Admitting: Radiation Oncology

## 2015-07-01 ENCOUNTER — Ambulatory Visit (HOSPITAL_COMMUNITY): Payer: Medicare Other | Admitting: Hematology & Oncology

## 2015-07-03 ENCOUNTER — Ambulatory Visit: Payer: Medicare Other | Admitting: Radiation Oncology

## 2015-07-05 ENCOUNTER — Ambulatory Visit: Payer: Medicare Other | Admitting: Radiation Oncology

## 2015-07-08 ENCOUNTER — Ambulatory Visit: Payer: Medicare Other | Admitting: Radiation Oncology

## 2015-07-13 DEATH — deceased

## 2017-01-18 IMAGING — CR DG CHEST 1V PORT
1 series · 1 of 1 positions shown · non-contrast
Comparison: Portable chest 04/12/2015 and earlier.

CLINICAL DATA: 55-year-old male with shortness of breath. Seizure.
Initial encounter.

Right apical lung tumor, poorly differentiated carcinoma diagnosed
on CT guided biopsy 04/12/2015.
EXAM:
PORTABLE CHEST 1 VIEW

[ap portable]
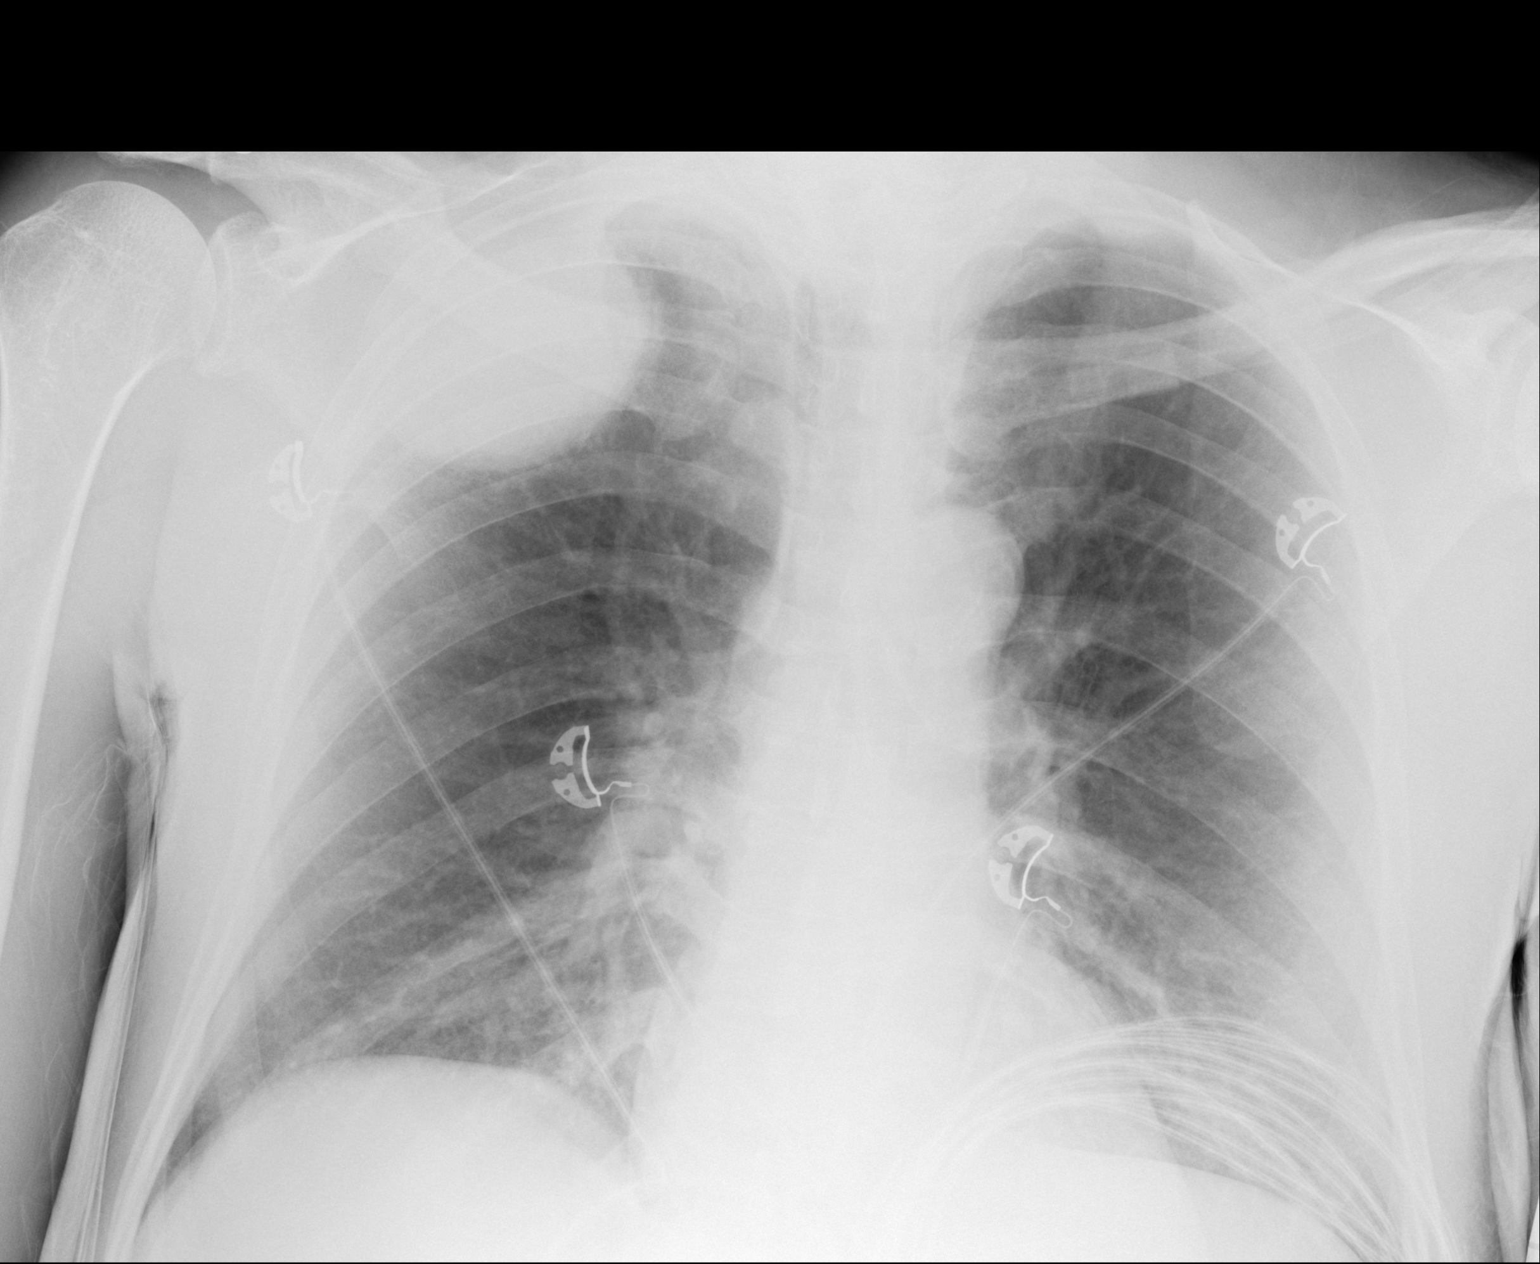

[1 of 1 positions shown; findings below may reference images not displayed]

FINDINGS: Portable AP semi upright view at 6232 hours. Radiographic
progression of the round right apical lung mass, size now estimated
at 8 cm diameter versus 7 cm previously. Mediastinal contours are
stable and within normal limits. No superimposed pneumothorax,
pulmonary edema, pleural effusion or acute pulmonary opacity.
IMPRESSION: 1. Some radiographic progression of the right apical lung tumor
since [DATE]. No new cardiopulmonary abnormality.
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
## 2017-01-19 IMAGING — CR DG CHEST 1V PORT
1 series · 1 of 1 positions shown · non-contrast
Comparison: 05/16/2015

CLINICAL DATA: Lung tumor

EXAM:
PORTABLE CHEST 1 VIEW

[pa]
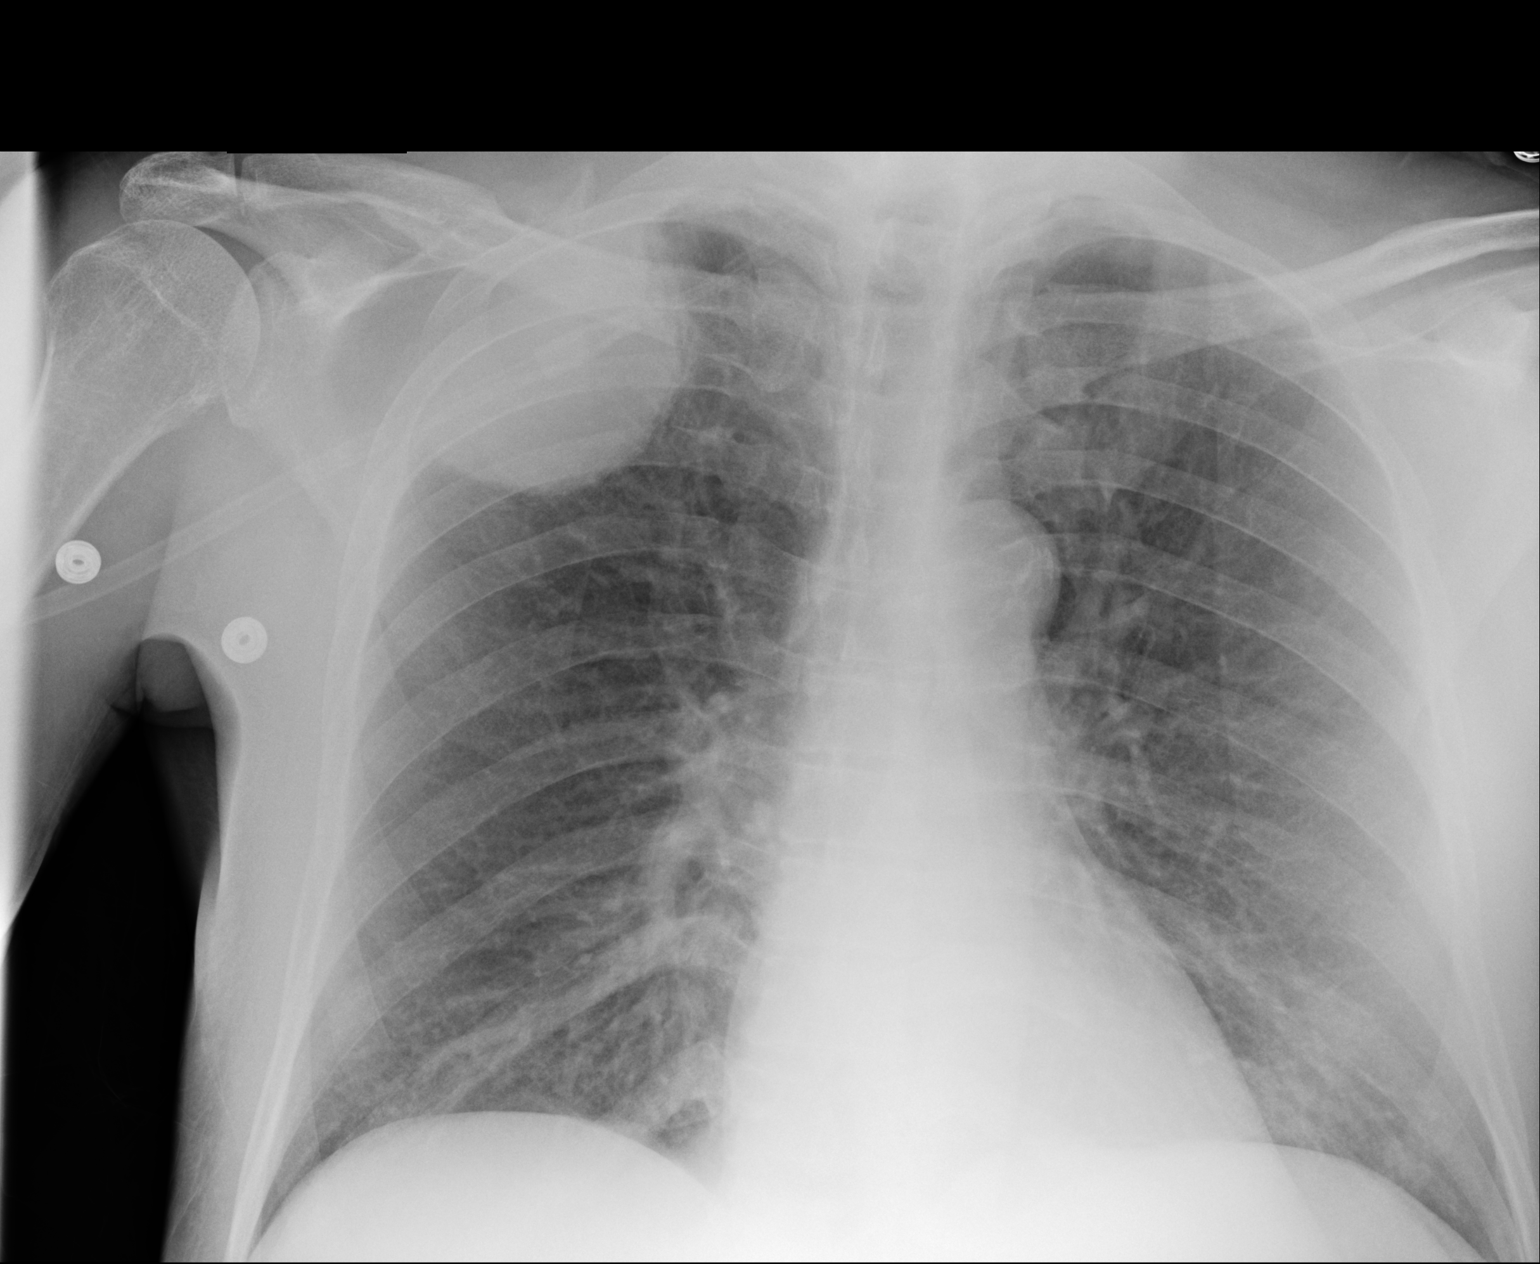

[1 of 1 positions shown; findings below may reference images not displayed]

FINDINGS: Stable right apical mass. Normal heart size. Lungs are otherwise
clear. No pneumothorax.
IMPRESSION: Stable right apical lung mass.

## 2017-06-08 ENCOUNTER — Encounter: Payer: Self-pay | Admitting: Gastroenterology
